# Patient Record
Sex: Female | Born: 1965 | Race: White | Hispanic: No | Smoking: Former smoker
Health system: Southern US, Community
[De-identification: ages and names within clinical notes are randomized; demographics above are authoritative.]

## PROBLEM LIST (undated history)

## (undated) DIAGNOSIS — R569 Unspecified convulsions: Secondary | ICD-10-CM

## (undated) DIAGNOSIS — J45909 Unspecified asthma, uncomplicated: Secondary | ICD-10-CM

## (undated) DIAGNOSIS — R112 Nausea with vomiting, unspecified: Secondary | ICD-10-CM

## (undated) DIAGNOSIS — T4145XA Adverse effect of unspecified anesthetic, initial encounter: Secondary | ICD-10-CM

## (undated) DIAGNOSIS — K648 Other hemorrhoids: Secondary | ICD-10-CM

## (undated) DIAGNOSIS — T8859XA Other complications of anesthesia, initial encounter: Secondary | ICD-10-CM

## (undated) DIAGNOSIS — IMO0001 Reserved for inherently not codable concepts without codable children: Secondary | ICD-10-CM

## (undated) DIAGNOSIS — F419 Anxiety disorder, unspecified: Secondary | ICD-10-CM

## (undated) DIAGNOSIS — F32A Depression, unspecified: Secondary | ICD-10-CM

## (undated) DIAGNOSIS — J42 Unspecified chronic bronchitis: Secondary | ICD-10-CM

## (undated) DIAGNOSIS — Z9889 Other specified postprocedural states: Secondary | ICD-10-CM

## (undated) DIAGNOSIS — K219 Gastro-esophageal reflux disease without esophagitis: Secondary | ICD-10-CM

## (undated) DIAGNOSIS — G43909 Migraine, unspecified, not intractable, without status migrainosus: Secondary | ICD-10-CM

## (undated) DIAGNOSIS — F329 Major depressive disorder, single episode, unspecified: Secondary | ICD-10-CM

## (undated) DIAGNOSIS — I8390 Asymptomatic varicose veins of unspecified lower extremity: Secondary | ICD-10-CM

## (undated) HISTORY — DX: Other hemorrhoids: K64.8

## (undated) HISTORY — DX: Unspecified asthma, uncomplicated: J45.909

## (undated) HISTORY — DX: Gastro-esophageal reflux disease without esophagitis: K21.9

## (undated) HISTORY — DX: Reserved for inherently not codable concepts without codable children: IMO0001

## (undated) HISTORY — DX: Migraine, unspecified, not intractable, without status migrainosus: G43.909

---

## 1996-07-31 HISTORY — PX: TYMPANOSTOMY TUBE PLACEMENT: SHX32

## 1996-07-31 HISTORY — PX: OTHER SURGICAL HISTORY: SHX169

## 1996-07-31 HISTORY — PX: HEMANGIOMA EXCISION: SHX1734

## 1998-03-17 ENCOUNTER — Ambulatory Visit (HOSPITAL_COMMUNITY): Admission: RE | Admit: 1998-03-17 | Discharge: 1998-03-17 | Payer: Self-pay | Admitting: Obstetrics and Gynecology

## 1998-07-05 ENCOUNTER — Encounter: Payer: Self-pay | Admitting: Orthopedic Surgery

## 1998-07-05 ENCOUNTER — Ambulatory Visit (HOSPITAL_COMMUNITY): Admission: RE | Admit: 1998-07-05 | Discharge: 1998-07-05 | Payer: Self-pay | Admitting: Orthopedic Surgery

## 1998-08-04 ENCOUNTER — Encounter: Payer: Self-pay | Admitting: Vascular Surgery

## 1998-08-04 ENCOUNTER — Ambulatory Visit (HOSPITAL_COMMUNITY): Admission: RE | Admit: 1998-08-04 | Discharge: 1998-08-04 | Payer: Self-pay | Admitting: Vascular Surgery

## 1998-08-18 ENCOUNTER — Ambulatory Visit (HOSPITAL_BASED_OUTPATIENT_CLINIC_OR_DEPARTMENT_OTHER): Admission: RE | Admit: 1998-08-18 | Discharge: 1998-08-18 | Payer: Self-pay | Admitting: Orthopedic Surgery

## 1998-09-09 ENCOUNTER — Encounter: Admission: RE | Admit: 1998-09-09 | Discharge: 1998-12-08 | Payer: Self-pay | Admitting: Orthopedic Surgery

## 1999-04-26 ENCOUNTER — Encounter: Payer: Self-pay | Admitting: Obstetrics and Gynecology

## 1999-04-26 ENCOUNTER — Ambulatory Visit (HOSPITAL_COMMUNITY): Admission: RE | Admit: 1999-04-26 | Discharge: 1999-04-26 | Payer: Self-pay | Admitting: Obstetrics and Gynecology

## 1999-05-08 ENCOUNTER — Inpatient Hospital Stay (HOSPITAL_COMMUNITY): Admission: AD | Admit: 1999-05-08 | Discharge: 1999-05-08 | Payer: Self-pay | Admitting: Obstetrics and Gynecology

## 1999-05-23 ENCOUNTER — Ambulatory Visit (HOSPITAL_COMMUNITY): Admission: RE | Admit: 1999-05-23 | Discharge: 1999-05-23 | Payer: Self-pay | Admitting: Obstetrics and Gynecology

## 1999-05-23 ENCOUNTER — Encounter: Payer: Self-pay | Admitting: Obstetrics and Gynecology

## 1999-06-29 ENCOUNTER — Inpatient Hospital Stay (HOSPITAL_COMMUNITY): Admission: AD | Admit: 1999-06-29 | Discharge: 1999-06-29 | Payer: Self-pay | Admitting: Obstetrics and Gynecology

## 1999-07-27 ENCOUNTER — Inpatient Hospital Stay (HOSPITAL_COMMUNITY): Admission: AD | Admit: 1999-07-27 | Discharge: 1999-07-27 | Payer: Self-pay | Admitting: Obstetrics and Gynecology

## 1999-08-04 ENCOUNTER — Inpatient Hospital Stay (HOSPITAL_COMMUNITY): Admission: AD | Admit: 1999-08-04 | Discharge: 1999-08-04 | Payer: Self-pay | Admitting: Obstetrics and Gynecology

## 1999-08-05 ENCOUNTER — Inpatient Hospital Stay (HOSPITAL_COMMUNITY): Admission: AD | Admit: 1999-08-05 | Discharge: 1999-08-05 | Payer: Self-pay | Admitting: Obstetrics and Gynecology

## 1999-09-14 ENCOUNTER — Inpatient Hospital Stay (HOSPITAL_COMMUNITY): Admission: AD | Admit: 1999-09-14 | Discharge: 1999-09-14 | Payer: Self-pay | Admitting: Obstetrics and Gynecology

## 1999-09-18 ENCOUNTER — Inpatient Hospital Stay (HOSPITAL_COMMUNITY): Admission: AD | Admit: 1999-09-18 | Discharge: 1999-09-18 | Payer: Self-pay | Admitting: Obstetrics and Gynecology

## 1999-09-23 ENCOUNTER — Inpatient Hospital Stay (HOSPITAL_COMMUNITY): Admission: AD | Admit: 1999-09-23 | Discharge: 1999-09-23 | Payer: Self-pay | Admitting: Obstetrics and Gynecology

## 1999-09-26 ENCOUNTER — Inpatient Hospital Stay (HOSPITAL_COMMUNITY): Admission: AD | Admit: 1999-09-26 | Discharge: 1999-09-26 | Payer: Self-pay | Admitting: Obstetrics and Gynecology

## 1999-09-29 ENCOUNTER — Inpatient Hospital Stay (HOSPITAL_COMMUNITY): Admission: AD | Admit: 1999-09-29 | Discharge: 1999-09-29 | Payer: Self-pay | Admitting: Obstetrics and Gynecology

## 1999-09-30 ENCOUNTER — Inpatient Hospital Stay (HOSPITAL_COMMUNITY): Admission: AD | Admit: 1999-09-30 | Discharge: 1999-09-30 | Payer: Self-pay | Admitting: Obstetrics and Gynecology

## 1999-10-02 ENCOUNTER — Inpatient Hospital Stay (HOSPITAL_COMMUNITY): Admission: AD | Admit: 1999-10-02 | Discharge: 1999-10-02 | Payer: Self-pay | Admitting: Obstetrics and Gynecology

## 1999-10-04 ENCOUNTER — Inpatient Hospital Stay (HOSPITAL_COMMUNITY): Admission: AD | Admit: 1999-10-04 | Discharge: 1999-10-04 | Payer: Self-pay | Admitting: Obstetrics and Gynecology

## 1999-10-10 ENCOUNTER — Inpatient Hospital Stay (HOSPITAL_COMMUNITY): Admission: AD | Admit: 1999-10-10 | Discharge: 1999-10-12 | Payer: Self-pay | Admitting: Obstetrics and Gynecology

## 1999-10-15 ENCOUNTER — Encounter: Admission: RE | Admit: 1999-10-15 | Discharge: 2000-01-13 | Payer: Self-pay | Admitting: Obstetrics and Gynecology

## 1999-11-21 ENCOUNTER — Other Ambulatory Visit: Admission: RE | Admit: 1999-11-21 | Discharge: 1999-11-21 | Payer: Self-pay | Admitting: Obstetrics and Gynecology

## 2000-07-19 ENCOUNTER — Encounter: Admission: RE | Admit: 2000-07-19 | Discharge: 2000-07-19 | Payer: Self-pay | Admitting: Internal Medicine

## 2000-07-19 ENCOUNTER — Encounter: Payer: Self-pay | Admitting: Internal Medicine

## 2001-02-19 ENCOUNTER — Other Ambulatory Visit: Admission: RE | Admit: 2001-02-19 | Discharge: 2001-02-19 | Payer: Self-pay | Admitting: Obstetrics and Gynecology

## 2001-04-25 ENCOUNTER — Encounter: Payer: Self-pay | Admitting: Obstetrics and Gynecology

## 2001-04-25 ENCOUNTER — Ambulatory Visit (HOSPITAL_COMMUNITY): Admission: RE | Admit: 2001-04-25 | Discharge: 2001-04-25 | Payer: Self-pay | Admitting: Obstetrics and Gynecology

## 2001-07-16 ENCOUNTER — Observation Stay (HOSPITAL_COMMUNITY): Admission: AD | Admit: 2001-07-16 | Discharge: 2001-07-17 | Payer: Self-pay | Admitting: Obstetrics and Gynecology

## 2001-08-24 ENCOUNTER — Inpatient Hospital Stay (HOSPITAL_COMMUNITY): Admission: AD | Admit: 2001-08-24 | Discharge: 2001-08-24 | Payer: Self-pay | Admitting: Obstetrics and Gynecology

## 2001-09-01 ENCOUNTER — Inpatient Hospital Stay (HOSPITAL_COMMUNITY): Admission: AD | Admit: 2001-09-01 | Discharge: 2001-09-01 | Payer: Self-pay | Admitting: Obstetrics and Gynecology

## 2001-09-20 ENCOUNTER — Inpatient Hospital Stay (HOSPITAL_COMMUNITY): Admission: AD | Admit: 2001-09-20 | Discharge: 2001-09-22 | Payer: Self-pay | Admitting: Obstetrics and Gynecology

## 2002-11-26 ENCOUNTER — Emergency Department (HOSPITAL_COMMUNITY): Admission: EM | Admit: 2002-11-26 | Discharge: 2002-11-27 | Payer: Self-pay | Admitting: Emergency Medicine

## 2003-02-18 ENCOUNTER — Encounter: Payer: Self-pay | Admitting: Obstetrics and Gynecology

## 2003-02-18 ENCOUNTER — Ambulatory Visit (HOSPITAL_COMMUNITY): Admission: RE | Admit: 2003-02-18 | Discharge: 2003-02-18 | Payer: Self-pay | Admitting: Obstetrics and Gynecology

## 2003-03-18 ENCOUNTER — Encounter: Payer: Self-pay | Admitting: Obstetrics and Gynecology

## 2003-03-18 ENCOUNTER — Ambulatory Visit (HOSPITAL_COMMUNITY): Admission: RE | Admit: 2003-03-18 | Discharge: 2003-03-18 | Payer: Self-pay | Admitting: Obstetrics and Gynecology

## 2003-06-11 ENCOUNTER — Inpatient Hospital Stay (HOSPITAL_COMMUNITY): Admission: AD | Admit: 2003-06-11 | Discharge: 2003-06-11 | Payer: Self-pay | Admitting: Obstetrics and Gynecology

## 2003-07-09 ENCOUNTER — Inpatient Hospital Stay (HOSPITAL_COMMUNITY): Admission: AD | Admit: 2003-07-09 | Discharge: 2003-07-09 | Payer: Self-pay | Admitting: Obstetrics and Gynecology

## 2003-07-16 ENCOUNTER — Inpatient Hospital Stay (HOSPITAL_COMMUNITY): Admission: AD | Admit: 2003-07-16 | Discharge: 2003-07-18 | Payer: Self-pay | Admitting: Obstetrics and Gynecology

## 2004-06-13 ENCOUNTER — Other Ambulatory Visit: Admission: RE | Admit: 2004-06-13 | Discharge: 2004-06-13 | Payer: Self-pay | Admitting: Obstetrics and Gynecology

## 2004-07-31 HISTORY — PX: NASAL SEPTUM SURGERY: SHX37

## 2005-01-12 ENCOUNTER — Inpatient Hospital Stay (HOSPITAL_COMMUNITY): Admission: AD | Admit: 2005-01-12 | Discharge: 2005-01-12 | Payer: Self-pay | Admitting: Obstetrics and Gynecology

## 2005-04-02 ENCOUNTER — Inpatient Hospital Stay (HOSPITAL_COMMUNITY): Admission: AD | Admit: 2005-04-02 | Discharge: 2005-04-02 | Payer: Self-pay | Admitting: Obstetrics and Gynecology

## 2005-04-04 ENCOUNTER — Inpatient Hospital Stay (HOSPITAL_COMMUNITY): Admission: AD | Admit: 2005-04-04 | Discharge: 2005-04-06 | Payer: Self-pay | Admitting: Obstetrics and Gynecology

## 2005-05-16 ENCOUNTER — Other Ambulatory Visit: Admission: RE | Admit: 2005-05-16 | Discharge: 2005-05-16 | Payer: Self-pay | Admitting: Obstetrics and Gynecology

## 2005-07-17 ENCOUNTER — Ambulatory Visit (HOSPITAL_COMMUNITY): Admission: RE | Admit: 2005-07-17 | Discharge: 2005-07-17 | Payer: Self-pay | Admitting: Obstetrics and Gynecology

## 2005-12-31 ENCOUNTER — Emergency Department (HOSPITAL_COMMUNITY): Admission: EM | Admit: 2005-12-31 | Discharge: 2005-12-31 | Payer: Self-pay | Admitting: Emergency Medicine

## 2006-07-31 HISTORY — PX: KNEE SURGERY: SHX244

## 2007-08-01 HISTORY — PX: SHOULDER SURGERY: SHX246

## 2007-08-01 HISTORY — PX: CLOSED MANIPULATION SHOULDER: SUR205

## 2007-12-30 ENCOUNTER — Encounter: Admission: RE | Admit: 2007-12-30 | Discharge: 2008-03-29 | Payer: Self-pay | Admitting: Orthopedic Surgery

## 2008-04-02 ENCOUNTER — Encounter: Admission: RE | Admit: 2008-04-02 | Discharge: 2008-05-21 | Payer: Self-pay | Admitting: Orthopedic Surgery

## 2008-11-27 ENCOUNTER — Ambulatory Visit: Payer: Self-pay | Admitting: Diagnostic Radiology

## 2008-11-27 ENCOUNTER — Emergency Department (HOSPITAL_BASED_OUTPATIENT_CLINIC_OR_DEPARTMENT_OTHER): Admission: EM | Admit: 2008-11-27 | Discharge: 2008-11-28 | Payer: Self-pay | Admitting: Emergency Medicine

## 2008-12-31 ENCOUNTER — Encounter: Admission: RE | Admit: 2008-12-31 | Discharge: 2009-03-31 | Payer: Self-pay | Admitting: Orthopedic Surgery

## 2010-02-01 ENCOUNTER — Ambulatory Visit
Admission: RE | Admit: 2010-02-01 | Discharge: 2010-02-01 | Payer: Self-pay | Source: Home / Self Care | Admitting: Obstetrics and Gynecology

## 2010-02-01 ENCOUNTER — Encounter (INDEPENDENT_AMBULATORY_CARE_PROVIDER_SITE_OTHER): Payer: Self-pay | Admitting: Obstetrics and Gynecology

## 2010-02-01 ENCOUNTER — Ambulatory Visit: Payer: Self-pay | Admitting: Surgery

## 2010-03-18 ENCOUNTER — Ambulatory Visit: Payer: Self-pay | Admitting: Obstetrics and Gynecology

## 2010-03-18 ENCOUNTER — Inpatient Hospital Stay (HOSPITAL_COMMUNITY)
Admission: AD | Admit: 2010-03-18 | Discharge: 2010-03-18 | Payer: Self-pay | Source: Home / Self Care | Admitting: Obstetrics and Gynecology

## 2010-03-25 ENCOUNTER — Inpatient Hospital Stay (HOSPITAL_COMMUNITY)
Admission: AD | Admit: 2010-03-25 | Discharge: 2010-03-27 | Payer: Self-pay | Source: Home / Self Care | Admitting: Obstetrics and Gynecology

## 2010-07-31 HISTORY — PX: FOOT SURGERY: SHX648

## 2010-10-14 LAB — COMPREHENSIVE METABOLIC PANEL
AST: 19 U/L (ref 0–37)
Albumin: 3 g/dL — ABNORMAL LOW (ref 3.5–5.2)
Calcium: 9.1 mg/dL (ref 8.4–10.5)
Creatinine, Ser: 0.5 mg/dL (ref 0.4–1.2)
GFR calc Af Amer: >60 mL/min (ref >60)

## 2010-10-14 LAB — CBC
HCT: 36.8 % (ref 36.0–46.0)
MCH: 33.3 pg (ref 26.0–34.0)
MCH: 33.6 pg (ref 26.0–34.0)
MCHC: 34.7 g/dL (ref 30.0–36.0)
MCHC: 34.7 g/dL (ref 30.0–36.0)
MCV: 96.9 fL (ref 78.0–100.0)
Platelets: 146 10*3/uL — ABNORMAL LOW (ref 150–400)
Platelets: 171 10*3/uL (ref 150–400)
Platelets: 179 10*3/uL (ref 150–400)
RDW: 13.8 % (ref 11.5–15.5)
RDW: 13.8 % (ref 11.5–15.5)
RDW: 13.9 % (ref 11.5–15.5)
WBC: 8.9 10*3/uL (ref 4.0–10.5)

## 2010-10-14 LAB — URINALYSIS, ROUTINE W REFLEX MICROSCOPIC
Bilirubin Urine: NEGATIVE
Hgb urine dipstick: NEGATIVE
Protein, ur: NEGATIVE mg/dL
Specific Gravity, Urine: 1.015 (ref 1.005–1.030)
Urobilinogen, UA: 0.2 mg/dL (ref 0.0–1.0)
pH: 6.5 (ref 5.0–8.0)

## 2010-10-14 LAB — CCBB MATERNAL DONOR DRAW

## 2010-12-16 NOTE — Consult Note (Signed)
Jamie Sweeney, Jamie Sweeney              ACCOUNT NO.:  [id number]   MEDICAL RECORD NO.:  [id number]            PATIENT TYPE:   LOCATION:                                 FACILITY:   PHYSICIAN:  Karol T. Lazarus Salines, M.D.      DATE OF BIRTH:   DATE OF CONSULTATION:  12/31/2005  DATE OF DISCHARGE:                                   CONSULTATION   EMERGENCY ROOM CONSULTATION   CHIEF COMPLAINT:  Nasal pain.   HISTORY:  A 45 year old white female underwent nasal septoplasty with Dr.  Jearld Fenton 2 days ago.  She is having relatively intense pain with the nasal  packing, and a sense of claustrophobia/air hunger.  No active bleeding.  No  fever.  She had 2 days of Lortab which she has now exhausted.   PHYSICAL EXAMINATION:  This is a trim healthy-appearing adult white female.  She has an nasal drip pad in place and packs in place as well.  Pharynx is  clear.   IMPRESSION:  Satisfactory postop check.   PLAN:  Packs removed each side x1.  She tolerated this nicely.  I have her  an additional prescription for Lortab 7.5/500 one p.o. q.4-6 h., #20, one  refill.  She will practice nasal hygiene measures now, including liberal  nasal saline spray.  Recheck Dr. Jearld Fenton in 2 weeks.      Gloris Manchester. Lazarus Salines, M.D.  Electronically Signed     KTW/MEDQ  D:  12/31/2005  T:  01/01/2006  Job:  [id number]

## 2010-12-16 NOTE — Discharge Summary (Signed)
Jamie Sweeney, Jamie Sweeney              ACCOUNT NO.:  [id number]   MEDICAL RECORD NO.:  [id number]          PATIENT TYPE:  INP   LOCATION:  9113                          FACILITY:  [facility]   PHYSICIAN:  Zenaida Niece, M.D.DATE OF BIRTH:  1965/10/01   DATE OF ADMISSION:  04/04/2005  DATE OF DISCHARGE:  04/06/2005                                 DISCHARGE SUMMARY   ADMISSION DIAGNOSES:  1.  Intrauterine pregnancy at 39 weeks, grand multiparous patient.  2.  Advanced maternal age.   DISCHARGE DIAGNOSES:  1.  Intrauterine pregnancy at 39 weeks, grand multiparous patient.  2.  Advanced maternal age.   PROCEDURE:  On September 5, she had a spontaneous vaginal delivery.   HISTORY AND PHYSICAL:  This is a 45 year old, white female, G6, P5-0-0-5  with an EGA of [redacted] weeks who presents for elective induction.  Prenatal care  complicated by advanced maternal age with normal first trimester screening  and normal ultrasound and she also had bleeding hemorrhoids.   PAST OBSTETRICAL HISTORY:  Vaginal delivery at term x5 with weights ranging  from 7 pounds 8 ounces to 8 pounds 7 ounces.   PAST MEDICAL HISTORY:  1.  Mild asthma.  2.  Migraines.  3.  Back problems.   PAST SURGICAL HISTORY:  Vulvoplasty.   ALLERGIES:  SULFA.   CURRENT MEDICATIONS:  Analpram.   PRENATAL LABORATORY DATA:  Blood type is A+ with a negative antibody screen,  RPR nonreactive, rubella immune, hepatitis B surface antigen negative.  Gonorrhea and chlamydia negative.  One-hour Glucola is 85 and Group B  Streptococcus is negative.   PHYSICAL EXAMINATION:  VITAL SIGNS:  She is afebrile with stable vital  signs.  Fetal heart tracing is reactive with occasional contractions.  ABDOMEN:  Gravid, nontender with estimated fetal weight of 8 pounds.  PELVIC:  Cervix was 3-4, 50, -2, vertex presentation and amniotomy revealed  clear fluid.   HOSPITAL COURSE:  The patient was admitted and had amniotomy performed for  induction.   She then required Pitocin to enter active labor.  She entered  labor, received an epidural and progressed to complete and pushed well.  On  the afternoon of September5, she had a vaginal delivery of a viable female  infant with Apgar's of 7 and 9 that weighed 8 pounds 2 ounces.  The baby  delivered through a shoulder cord and there was terminal meconium.  Placenta  delivered spontaneous and was intact.  Perineum had a few small superficial  abrasions that were hemostatic and estimated blood loss was less than 500  cc.  Postpartum, she had no significant complications and on postpartum day  #2, is stable for discharge home.   DIET:  Regular diet.   ACTIVITY:  Pelvic rest.   FOLLOW UP:  Follow up in 6 weeks.   DISCHARGE MEDICATIONS:  1.  Over-the-counter ibuprofen as needed.  2.  Percocet #20 one to two p.o. q.4-6h. p.r.n. pain.   SPECIAL INSTRUCTIONS:  She is also given our discharge pamphlet.      Zenaida Niece, M.D.  Electronically Signed  TDM/MEDQ  D:  04/06/2005  T:  04/06/2005  Job:  [id number]

## 2011-03-29 ENCOUNTER — Other Ambulatory Visit: Payer: Self-pay | Admitting: Obstetrics and Gynecology

## 2011-03-29 DIAGNOSIS — N6310 Unspecified lump in the right breast, unspecified quadrant: Secondary | ICD-10-CM

## 2011-04-05 ENCOUNTER — Ambulatory Visit
Admission: RE | Admit: 2011-04-05 | Discharge: 2011-04-05 | Disposition: A | Discharge status: Home / Self Care | Payer: Commercial Indemnity | Source: Ambulatory Visit | Attending: Obstetrics and Gynecology | Admitting: Obstetrics and Gynecology

## 2011-04-05 DIAGNOSIS — N6310 Unspecified lump in the right breast, unspecified quadrant: Secondary | ICD-10-CM

## 2011-06-07 ENCOUNTER — Ambulatory Visit: Payer: Commercial Indemnity | Attending: Sports Medicine | Admitting: Physical Therapy

## 2011-06-07 DIAGNOSIS — M25673 Stiffness of unspecified ankle, not elsewhere classified: Secondary | ICD-10-CM | POA: Insufficient documentation

## 2011-06-07 DIAGNOSIS — R262 Difficulty in walking, not elsewhere classified: Secondary | ICD-10-CM | POA: Insufficient documentation

## 2011-06-07 DIAGNOSIS — M25579 Pain in unspecified ankle and joints of unspecified foot: Secondary | ICD-10-CM | POA: Insufficient documentation

## 2011-06-07 DIAGNOSIS — M25676 Stiffness of unspecified foot, not elsewhere classified: Secondary | ICD-10-CM | POA: Insufficient documentation

## 2011-06-07 DIAGNOSIS — IMO0001 Reserved for inherently not codable concepts without codable children: Secondary | ICD-10-CM | POA: Insufficient documentation

## 2011-06-09 ENCOUNTER — Ambulatory Visit: Payer: Commercial Indemnity | Admitting: Physical Therapy

## 2011-06-12 ENCOUNTER — Ambulatory Visit: Payer: Commercial Indemnity | Admitting: Physical Therapy

## 2011-06-15 ENCOUNTER — Ambulatory Visit: Payer: Commercial Indemnity | Admitting: Physical Therapy

## 2011-06-20 ENCOUNTER — Ambulatory Visit: Payer: Commercial Indemnity | Admitting: Physical Therapy

## 2011-06-29 ENCOUNTER — Ambulatory Visit: Payer: Commercial Indemnity | Admitting: Physical Therapy

## 2011-06-30 ENCOUNTER — Ambulatory Visit: Payer: Commercial Indemnity | Admitting: Physical Therapy

## 2011-07-11 ENCOUNTER — Ambulatory Visit: Payer: Commercial Indemnity | Attending: Sports Medicine | Admitting: Physical Therapy

## 2011-07-11 DIAGNOSIS — M25673 Stiffness of unspecified ankle, not elsewhere classified: Secondary | ICD-10-CM | POA: Insufficient documentation

## 2011-07-11 DIAGNOSIS — M25579 Pain in unspecified ankle and joints of unspecified foot: Secondary | ICD-10-CM | POA: Insufficient documentation

## 2011-07-11 DIAGNOSIS — IMO0001 Reserved for inherently not codable concepts without codable children: Secondary | ICD-10-CM | POA: Insufficient documentation

## 2011-07-11 DIAGNOSIS — R262 Difficulty in walking, not elsewhere classified: Secondary | ICD-10-CM | POA: Insufficient documentation

## 2011-07-11 DIAGNOSIS — M25676 Stiffness of unspecified foot, not elsewhere classified: Secondary | ICD-10-CM | POA: Insufficient documentation

## 2011-07-13 ENCOUNTER — Ambulatory Visit: Payer: Commercial Indemnity | Admitting: Physical Therapy

## 2012-03-12 ENCOUNTER — Encounter (INDEPENDENT_AMBULATORY_CARE_PROVIDER_SITE_OTHER): Payer: Self-pay | Admitting: General Surgery

## 2012-03-12 ENCOUNTER — Encounter (INDEPENDENT_AMBULATORY_CARE_PROVIDER_SITE_OTHER): Payer: Self-pay

## 2012-03-25 ENCOUNTER — Ambulatory Visit (INDEPENDENT_AMBULATORY_CARE_PROVIDER_SITE_OTHER): Payer: Commercial Indemnity | Admitting: Surgery

## 2012-03-31 HISTORY — PX: HEMORRHOID BANDING: SHX5850

## 2012-04-10 ENCOUNTER — Encounter (INDEPENDENT_AMBULATORY_CARE_PROVIDER_SITE_OTHER): Payer: Self-pay | Admitting: Surgery

## 2012-04-10 ENCOUNTER — Telehealth (INDEPENDENT_AMBULATORY_CARE_PROVIDER_SITE_OTHER): Payer: Self-pay | Admitting: General Surgery

## 2012-04-10 ENCOUNTER — Ambulatory Visit (INDEPENDENT_AMBULATORY_CARE_PROVIDER_SITE_OTHER): Payer: Commercial Indemnity | Admitting: Surgery

## 2012-04-10 VITALS — BP 116/70 | HR 69 | Ht 63.0 in | Wt 170.2 lb

## 2012-04-10 DIAGNOSIS — K219 Gastro-esophageal reflux disease without esophagitis: Secondary | ICD-10-CM | POA: Insufficient documentation

## 2012-04-10 DIAGNOSIS — K648 Other hemorrhoids: Secondary | ICD-10-CM

## 2012-04-10 HISTORY — DX: Other hemorrhoids: K64.8

## 2012-04-10 NOTE — Progress Notes (Signed)
Subjective:     Patient ID: Jamie Sweeney, female   DOB: April 19, 1966, 46 y.o.   MRN: [id number]  HPI  BRETTNEY FICKEN  1965-09-19 [id number]  Patient Care Team: Mardelle Matte as PCP - General Vertell Novak., MD as Consulting Physician (Gastroenterology)  This patient is a 46 y.o.female who presents today for surgical evaluation at the request of Dr. Randa Evens.   Reason for visit: Rectal bleeding.  Internal hemorrhoids.  Pleasant woman.  Noticed with her fourth pregnancy rectal bleeding.  Sigmoidoscopy done.  Found to have internal hemorrhoids.  Has had a few more children.  Mild intermittent rectal bleeding.  Had episode of chest pain and choking episode.  Cardiac workup negative.  EGD done.  Had colonoscopy.  No other abnormalities except for moderate internal hemorrhoids.  [facility]imeter for evaluation.  She used to struggle with intermittent constipation and diarrhea.  Takes a fiber supplement.  Now having one to two bowel today.  Still struggle with bleeding.  No definite diagnosis of IBS.  No personal nor family history of GI/colon cancer, inflammatory bowel disease, irritable bowel syndrome, allergy such as Celiac Sprue, dietary/dairy problems, colitis, ulcers nor gastritis.  No recent sick contacts/gastroenteritis.  No travel outside the country.  No changes in diet.    Patient Active Problem List  Diagnosis  . Internal hemorrhoids with bleeding  . GERD (gastroesophageal reflux disease)    Past Medical History  Diagnosis Date  . Asthma   . Hemorrhoids   . Migraine headache   . Reflux   . GERD (gastroesophageal reflux disease)     Past Surgical History  Procedure Date  . Vulvoplasty 1998  . Hemangioma excision 1998    right hand  . Shoulder surgery 2009    right  . Knee surgery 2009    right  . Foot surgery 2012    right plantar    History   Social History  . Marital Status: Married    Spouse Name: N/A    Number of Children: N/A  . Years of Education:  N/A   Occupational History  . Not on file.   Social History Main Topics  . Smoking status: Former Smoker    Quit date: 03/13/1995  . Smokeless tobacco: Not on file  . Alcohol Use: Yes     once every 3-4 months  . Drug Use: No  . Sexually Active: Not on file   Other Topics Concern  . Not on file   Social History Narrative  . No narrative on file    Family History  Problem Relation Age of Onset  . Prostate cancer Father   . Cancer Father     prostate/ skin  . Liver cancer Paternal Grandfather   . Cancer Paternal Grandfather     lung  . Cancer Paternal Aunt     breast  . Cancer Paternal Grandmother     breast  . Cancer Maternal Uncle     throat/skin    Current Outpatient Prescriptions  Medication Sig Dispense Refill  . albuterol (PROVENTIL) (2.5 MG/3ML) 0.083% nebulizer solution Take 2.5 mg by nebulization every 6 (six) hours as needed.      Marland Kitchen azelastine (ASTELIN) 137 MCG/SPRAY nasal spray Place 1 spray into the nose 2 (two) times daily. Use in each nostril as directed      . cetirizine (ZYRTEC) 10 MG tablet Take 10 mg by mouth daily.      . fexofenadine (ALLEGRA) 180 MG tablet Take 180  mg by mouth daily.      . fluticasone (VERAMYST) 27.5 MCG/SPRAY nasal spray Place 2 sprays into the nose daily.      Marland Kitchen ketotifen (ZADITOR) 0.025 % ophthalmic solution 1 drop 2 (two) times daily.      . montelukast (SINGULAIR) 10 MG tablet Take 10 mg by mouth at bedtime.      . Multiple Vitamin (MULTIVITAMIN) tablet Take 1 tablet by mouth daily.      . Olopatadine HCl (PATADAY) 0.2 % SOLN Apply to eye.      Marland Kitchen omeprazole (PRILOSEC) 40 MG capsule Take 40 mg by mouth daily.      . traMADol (ULTRAM) 50 MG tablet Take 50 mg by mouth every 6 (six) hours as needed.         Allergies  Allergen Reactions  . Diflucan (Fluconazole) Hives and Rash  . Sulfa Antibiotics Rash    BP 116/70  Pulse 69  Ht 5\' 3"  (1.6 m)  Wt 170 lb 3.2 oz (77.202 kg)  BMI 30.15 kg/m2  SpO2 98%  No results  found.   Review of Systems  Constitutional: Negative for fever, chills, diaphoresis, appetite change and fatigue.  HENT: Negative for ear pain, sore throat, trouble swallowing, neck pain and ear discharge.   Eyes: Negative for photophobia, discharge and visual disturbance.  Respiratory: Negative for cough, choking, chest tightness and shortness of breath.   Cardiovascular: Negative for chest pain and palpitations.  Gastrointestinal: Negative for nausea, vomiting, abdominal pain, diarrhea, constipation, anal bleeding and rectal pain.  Genitourinary: Negative for dysuria, frequency and difficulty urinating.  Musculoskeletal: Negative for myalgias and gait problem.  Skin: Negative for color change, pallor and rash.  Neurological: Negative for dizziness, speech difficulty, weakness and numbness.  Hematological: Negative for adenopathy.  Psychiatric/Behavioral: Negative for confusion and agitation. The patient is not nervous/anxious.        Objective:   Physical Exam  Constitutional: She is oriented to person, place, and time. She appears well-developed and well-nourished. No distress.  HENT:  Head: Normocephalic.  Mouth/Throat: Oropharynx is clear and moist. No oropharyngeal exudate.  Eyes: Conjunctivae normal and EOM are normal. Pupils are equal, round, and reactive to light. No scleral icterus.  Neck: Normal range of motion. Neck supple. No tracheal deviation present.  Cardiovascular: Normal rate, regular rhythm and intact distal pulses.   Pulmonary/Chest: Effort normal and breath sounds normal. No respiratory distress. She exhibits no tenderness.  Abdominal: Soft. She exhibits no distension and no mass. There is no tenderness. Hernia confirmed negative in the right inguinal area and confirmed negative in the left inguinal area.  Genitourinary: No vaginal discharge found.       Exam done with assistance of female Medical Assistant in the room.  Perianal skin clean with good hygiene.   No pruritis.  No external skin tags / hemorrhoids of significance.  No pilonidal disease.  No fissure.  No abscess/fistula.   Tolerates digital and anoscopic rectal exam.  Normal sphincter tone.  No rectal masses.  Hemorrhoidal piles moderately enlarged with mild R ant hem prolapse  Musculoskeletal: Normal range of motion. She exhibits no tenderness.  Lymphadenopathy:    She has no cervical adenopathy.       Right: No inguinal adenopathy present.       Left: No inguinal adenopathy present.  Neurological: She is alert and oriented to person, place, and time. No cranial nerve deficit. She exhibits normal muscle tone. Coordination normal.  Skin: Skin is warm and dry.  No rash noted. She is not diaphoretic. No erythema.  Psychiatric: She has a normal mood and affect. Her behavior is normal. Judgment and thought content normal.       Assessment:     Int hemorrhoids with bleeding despite better BMs on fiber bowel regimen    Plan:     I banded them:  The anatomy & physiology of the anorectal region was discussed.  The pathophysiology of hemorrhoids and differential diagnosis was discussed.  Natural history progression  was discussed.   I stressed the importance of a bowel regimen to have daily soft bowel movements to minimize progression of disease.     The patient's symptoms are not adequately controlled.  Therefore, I recommended banding to treat the hemorrhoids.  I went over the technique, risks, benefits, and alternatives.   Goals of post-operative recovery were discussed as well.  Questions were answered.  The patient expressed understanding & wished to proceed.  The patient was positioned in the lateral decubitus position.  Perianal & rectal examination was done.  Using anoscopy, I ligated the hemorrhoids above the dentate line with banding x 3 piles.  The patient tolerated the procedure well.  Educational handouts further explaining the pathology, treatment options, and bowel regimen were  given as well.    Hopefully, with her better bowel regimen she will avoid any further issues.  May benefit from re\re banding.  If having persistent bleeding, may need the [facility] hemorrhoidal ligation and pexy.  I'm hopeful will not come to that.  She will call if further issues arise.

## 2012-04-10 NOTE — Telephone Encounter (Signed)
Patient called to report passing one of her bands, some tissue and some blood with her stool this evening.  She denies profuse bleeding, inability to urinate, passing clots or fevers.  She does have some pelvic pressure.  I told her that as long as she doesn't develop the symptoms above, her pressure and bleeding should resolve.  She will call me back if her symptoms worsen.

## 2012-04-10 NOTE — Patient Instructions (Addendum)

## 2012-09-28 HISTORY — PX: HEMORRHOID BANDING: SHX5850

## 2012-10-08 ENCOUNTER — Ambulatory Visit (INDEPENDENT_AMBULATORY_CARE_PROVIDER_SITE_OTHER): Payer: Commercial Indemnity | Admitting: Surgery

## 2012-10-08 ENCOUNTER — Encounter (INDEPENDENT_AMBULATORY_CARE_PROVIDER_SITE_OTHER): Payer: Self-pay | Admitting: Surgery

## 2012-10-08 VITALS — BP 116/78 | HR 80 | Temp 98.4°F | Resp 16 | Ht 63.0 in | Wt 136.8 lb

## 2012-10-08 DIAGNOSIS — K648 Other hemorrhoids: Secondary | ICD-10-CM

## 2012-10-08 NOTE — Patient Instructions (Addendum)
ANORECTAL SURGERY: POST OP INSTRUCTIONS  1. Take your usually prescribed home medications unless otherwise directed. 2. DIET: Follow a light bland diet the first 24 hours after arrival home, such as soup, liquids, crackers, etc.  Be sure to include lots of fluids daily.  Avoid fast food or heavy meals as your are more likely to get nauseated.  Eat a low fat the next few days after surgery.   3. PAIN CONTROL: a. Pain is best controlled by a usual combination of three different methods TOGETHER: i. Ice/Heat ii. Over the counter pain medication iii. Prescription pain medication b. Most patients will experience some swelling and discomfort in the anus/rectal area. and incisions.  Ice packs or heat (30-60 minutes up to 6 times a day) will help. Use ice for the first few days to help decrease swelling and bruising, then switch to heat such as warm towels, sitz baths, warm baths, etc to help relax tight/sore spots and speed recovery.  Some people prefer to use ice alone, heat alone, alternating between ice & heat.  Experiment to what works for you.  Swelling and bruising can take several weeks to resolve.   c. It is helpful to take an over-the-counter pain medication regularly for the first few weeks.  Choose one of the following that works best for you: i. Naproxen (Aleve, etc)  Two 220mg tabs twice a day ii. Ibuprofen (Advil, etc) Three 200mg tabs four times a day (every meal & bedtime) iii. Acetaminophen (Tylenol, etc) 500-650mg four times a day (every meal & bedtime) d. A  prescription for pain medication (such as oxycodone, hydrocodone, etc) should be given to you upon discharge.  Take your pain medication as prescribed.  i. If you are having problems/concerns with the prescription medicine (does not control pain, nausea, vomiting, rash, itching, etc), please call us [phone] to see if we need to switch you to a different pain medicine that will work better for you and/or control your side effect  better. ii. If you need a refill on your pain medication, please contact your pharmacy.  They will contact our office to request authorization. Prescriptions will not be filled after 5 pm or on week-ends. 4. KEEP YOUR BOWELS REGULAR a. The goal is one bowel movement a day b. Avoid getting constipated.  Between the surgery and the pain medications, it is common to experience some constipation.  Increasing fluid intake and taking a fiber supplement (such as Metamucil, Citrucel, FiberCon, MiraLax, etc) 1-2 times a day regularly will usually help prevent this problem from occurring.  A mild laxative (prune juice, Milk of Magnesia, MiraLax, etc) should be taken according to package directions if there are no bowel movements after 48 hours. c. Watch out for diarrhea.  If you have many loose bowel movements, simplify your diet to bland foods & liquids for a few days.  Stop any stool softeners and decrease your fiber supplement.  Switching to mild anti-diarrheal medications (Kayopectate, Pepto Bismol) can help.  If this worsens or does not improve, please call us.  5. Wound Care a. Remove your bandages the day after surgery.  Unless discharge instructions indicate otherwise, leave your bandage dry and in place overnight.  Remove the bandage during your first bowel movement.   b. Allow the wound packing to fall out over the next few days.  You can trim exposed gauze / ribbon as it falls out.  You do not need to repack the wound unless instructed otherwise.  Wear an   absorbent pad or soft cotton gauze in your underwear as needed to catch any drainage and help keep the area  c. Keep the area clean and dry.  Bathe / shower every day.  Keep the area clean by showering / bathing over the incision / wound.   It is okay to soak an open wound to help wash it.  Wet wipes or showers / gentle washing after bowel movements is often less traumatic than regular toilet paper. d. You may have some styrofoam-like soft packing in  the rectum which will come out with the first bowel movement.  e. You will often notice bleeding with bowel movements.  This should slow down by the end of the first week of surgery f. Expect some drainage.  This should slow down, too, by the end of the first week of surgery.  Wear an absorbent pad or soft cotton gauze in your underwear until the drainage stops. 6. ACTIVITIES as tolerated:   a. You may resume regular (light) daily activities beginning the next day-such as daily self-care, walking, climbing stairs-gradually increasing activities as tolerated.  If you can walk 30 minutes without difficulty, it is safe to try more intense activity such as jogging, treadmill, bicycling, low-impact aerobics, swimming, etc. b. Save the most intensive and strenuous activity for last such as sit-ups, heavy lifting, contact sports, etc  Refrain from any heavy lifting or straining until you are off narcotics for pain control.   c. DO NOT PUSH THROUGH PAIN.  Let pain be your guide: If it hurts to do something, don't do it.  Pain is your body warning you to avoid that activity for another week until the pain goes down. d. You may drive when you are no longer taking prescription pain medication, you can comfortably sit for long periods of time, and you can safely maneuver your car and apply brakes. e. You may have sexual intercourse when it is comfortable.  7. FOLLOW UP in our office a. Please call [facility] at [phone] to set up an appointment to see your surgeon in the office for a follow-up appointment approximately 2 weeks after your surgery. b. Make sure that you call for this appointment the day you arrive home to insure a convenient appointment time. 10. IF YOU HAVE DISABILITY OR FAMILY LEAVE FORMS, BRING THEM TO THE OFFICE FOR PROCESSING.  DO NOT GIVE THEM TO YOUR DOCTOR.        WHEN TO CALL US [phone]: 1. Poor pain control 2. Reactions / problems with new medications (rash/itching, nausea,  etc)  3. Fever over 101.5 F (38.5 C) 4. Inability to urinate 5. Nausea and/or vomiting 6. Worsening swelling or bruising 7. Continued bleeding from incision. 8. Increased pain, redness, or drainage from the incision  The clinic staff is available to answer your questions during regular business hours (8:30am-5pm).  Please don't hesitate to call and ask to speak to one of our nurses for clinical concerns.   A surgeon from [facility] is always on call at the hospitals   If you have a medical emergency, go to the nearest emergency room or call 911.    [facility] [address], [address], [place], [place]  [address] ? MAIN: [phone] ? TOLL FREE: [phone] ? FAX [phone] [email or web address]   HEMORRHOIDS  The rectum is the last foot of your colon, and it naturally stretches to hold stool.  Hemorrhoidal piles are natural clusters of blood vessels   that help the rectum and anal canal stretch to hold stool and allow bowel movements to eliminate feces.   Hemorrhoids are abnormally swollen blood vessels in the rectum.  Too much pressure in the rectum causes hemorrhoids by forcing blood to stretch and bulge the walls of the veins, sometimes even rupturing them.  Hemorrhoids can become like varicose veins you might see on a person's legs.  Most people will develop a flare of hemorrhoids in their lifetime.  When bulging hemorrhoidal veins are irritated, they can swell, burn, itch, cause pain, and bleed.  Most flares will calm down gradually own within a few weeks.  However, once hemorrhoids are created, they are difficult to get rid of completely and tend to flare more easily than the first flare.   Fortunately, good habits and simple medical treatment usually control hemorrhoids well, and surgery is needed only in severe cases. Types of Hemorrhoids:  Internal hemorrhoids usually don't initially hurt or itch; they are deep inside the rectum  and usually have no sensation. If they begin to push out (prolapse), pain and burning can occur.  However, internal hemorrhoids can bleed.  Anal bleeding should not be ignored since bleeding could come from a dangerous source like colorectal cancer, so persistent rectal bleeding should be investigated by a doctor, sometimes with a colonoscopy.  External hemorrhoids cause most of the symptoms - pain, burning, and itching. Nonirritated hemorrhoids can look like small skin tags coming out of the anus.   Thrombosed hemorrhoids can form when a hemorrhoid blood vessel bursts and causes the hemorrhoid to suddenly swell.  A purple blood clot can form in it and become an excruciatingly painful lump at the anus. Because of these unpleasant symptoms, immediate incision and drainage by a surgeon at an office visit can provide much relief of the pain.    PREVENTION Avoiding the most frequent causes listed below will prevent most cases of hemorrhoids: Constipation Hard stools Diarrhea  Constant sitting  Straining with bowel movements Sitting on the toilet for a long time  Severe coughing  episodes Pregnancy / Childbirth  Heavy Lifting  Sometimes avoiding the above triggers is difficult:  How can you avoid sitting all day if you have a seated job? Also, we try to avoid coughing and diarrhea, but sometimes it's beyond your control.  Still, there are some practical hints to help: Keep the anal and genital area clean.  Moistened tissues such as flushable wet wipes are less irritating than toilet paper.  Using irrigating showers or bottle irrigation washing gently cleans this sensitive area.   Avoid dry toilet paper when cleaning after bowel movements.  . Keep the anal and genital area dry.  Lightly pat the rectal area dry.  Avoid rubbing.  Talcum or baby powders can help GET YOUR STOOLS SOFT.   This is the most important way to prevent irritated hemorrhoids.  Hard stools are like sandpaper to the anorectal canal and  will cause more problems.  The goal: ONE SOFT BOWEL MOVEMENT A DAY!  BMs from every other day to 3 times a day is a tolerable range Treat coughing, diarrhea and constipation early since irritated hemorrhoids may soon follow.  If your main job activity is seated, always stand or walk during your breaks. Make it a point to stand and walk at least 5 minutes every hour and try to shift frequently in your chair to avoid direct rectal pressure.  Always exhale as you strain or lift. Don't hold your breath.    Do not delay or try to prevent a bowel movement when the urge is present. Exercise regularly (walking or jogging 60 minutes a day) to stimulate the bowels to move. No reading or other activity while on the toilet. If bowel movements take longer than 5 minutes, you are too constipated. AVOID CONSTIPATION Drink plenty of liquids (1 1/2 to 2 quarts of water and other fluids a day unless fluid restricted for another medical condition). Liquids that contain caffeine (coffee a, tea, soft drinks) can be dehydrating and should be avoided until constipation is controlled. Consider minimizing milk, as dairy products may be constipating. Eat plenty of fiber (30g a day ideal, more if needed).  Fiber is the undigested part of plant food that passes into the colon, acting as "natures broom" to encourage bowel motility and movement.  Fiber can absorb and hold large amounts of water. This results in a larger, bulkier stool, which is soft and easier to pass.  Eating foods high in fiber - 12 servings - such as  Vegetables: Root (potatoes, carrots, turnips), Leafy green (lettuce, salad greens, celery, spinach), High residue (cabbage, broccoli, etc.) Fruit: Fresh, Dried (prunes, apricots, cherries), Stewed (applesauce)  Whole grain breads, pasta, whole wheat Bran cereals, muffins, etc. Consider adding supplemental bulking fiber which retains large volumes of water: Psyllium ground seeds --available as Metamucil, Konsyl,  Effersyllium, Per Diem Fiber, or the less expensive generic forms.  Citrucel  (methylcellulose wood fiber) . FiberCon (Polycarbophil) Polyethylene Glycol - and "artificial" fiber commonly called Miralax or Glycolax.  It is helpful for people with gassy or bloated feelings with regular fiber Flax Seed - a less gassy natural fiber  Laxatives can be useful for a short period if constipation is severe Osmotics (Milk of Magnesia, Fleets Phospho-Soda, Magnesium Citrate)  Stimulants (Senokot,   Castor Oil,  Dulcolax, Ex-Lax)    Laxatives are not a good long-term solution as it can stress the bowels and cause too much mineral loss and dehydration.   Avoid taking laxatives for more than 7 days in a row.  AVOID DIARRHEA Switch to liquids and simpler foods for a few days to avoid stressing your intestines further. Avoid dairy products (especially milk & ice cream) for a short time.  The intestines often can lose the ability to digest lactose when stressed. Avoid foods that cause gassiness or bloating.  Typical foods include beans and other legumes, cabbage, broccoli, and dairy foods.  Every person has some sensitivity to other foods, so listen to your body and avoid those foods that trigger problems for you. Adding fiber (Citrucel, Metamucil, FiberCon, Flax seed, Miralax) gradually can help thicken stools by absorbing excess fluid and retrain the intestines to act more normally.  Slowly increase the dose over a few weeks.  Too much fiber too soon can backfire and cause cramping & bloating. Probiotics (such as active yogurt, Align, etc) may help repopulate the intestines and colon with normal bacteria and calm down a sensitive digestive tract.  Most studies show it to be of mild help, though, and such products can be costly. Medicines: Bismuth subsalicylate (ex. Kayopectate, Pepto Bismol) every 30 minutes for up to 6 doses can help control diarrhea.  Avoid if pregnant. Loperamide (Immodium) can slow down  diarrhea.  Start with two tablets (4mg total) first and then try one tablet every 6 hours.  Avoid if you are having fevers or severe pain.  If you are not better or start feeling worse, stop all medicines and call your doctor   for advice Call your doctor if you are getting worse or not better.  Sometimes further testing (cultures, endoscopy, X-ray studies, bloodwork, etc) may be needed to help diagnose and treat the cause of the diarrhea. TREATMENT OF HEMORRHOID FLARE If these preventive measures fail, you must take action right away! Hemorrhoids are one condition that can be mild in the morning and become intolerable by nightfall. Most hemorrhoidal flares take several weeks to calm down.  These suggestions can help: Warm soaks.  This helps more than any topical medication.  Use up to 8 times a day.  Usually sitz baths or sitting in a warm bathtub helps.  Sitting on moist warm towels are helpful.  Switching to ice packs/cool compresses can be helpful Normalize your bowels.  Extremes of diarrhea or constipation will make hemorrhoids worse.  One soft bowel movement a day is the goal.  Fiber can help get your bowels regular Wet wipes instead of toilet paper Pain control with a NSAID such as ibuprofen (Advil) or naproxen (Aleve) or acetaminophen (Tylenol) around the clock.  Narcotics are constipating and should be minimized if possible Topical creams contain steroids (bydrocortisone) or local anesthetic (xylocaine) can help make pain and itching more tolerable.   EVALUATION If hemorrhoids are still causing problems, you could benefit by an evaluation by a surgeon.  The surgeon will obtain a history and examine you.  If hemorrhoids are diagnosed, some therapies can be offered in the office, usually with an anoscope into the less sensitive area of the rectum: -injection of hemorrhoids (sclerotherapy) can scar the blood vessels of the swollen/enlarged hemorrhoids to help shrink them down to a more normal  size -rubber banding of the enlarged hemorrhoids to help shrink them down to a more normal size -drainage of the blood clot causing a thrombosed hemorrhoid,  to relieve the severe pain   While 90% of the time such problems from hemorrhoids can be managed without preceding to surgery, sometimes the hemorrhoids require a operation to control the problem (uncontrolled bleeding, prolapse, pain, etc.).   This involves being placed under general anesthesia where the surgeon can confirm the diagnosis and remove, suture, or staple the hemorrhoid(s).  Your surgeon can help you treat the problem appropriately.    
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
## 2013-12-26 ENCOUNTER — Encounter (INDEPENDENT_AMBULATORY_CARE_PROVIDER_SITE_OTHER): Payer: Self-pay | Admitting: Surgery

## 2013-12-26 ENCOUNTER — Ambulatory Visit (INDEPENDENT_AMBULATORY_CARE_PROVIDER_SITE_OTHER): Payer: Medicaid Other | Admitting: Surgery

## 2013-12-26 VITALS — BP 130/74 | HR 74 | Temp 98.5°F | Ht 63.0 in | Wt 129.0 lb

## 2013-12-26 DIAGNOSIS — K648 Other hemorrhoids: Secondary | ICD-10-CM

## 2013-12-26 NOTE — Progress Notes (Signed)
Subjective:     Patient ID: Jamie Sweeney, female   DOB: Jul 13, 1966, 48 y.o.   MRN: [id number]  HPI   Jamie Sweeney  [birth year] [id number]  Patient Care Team: Mardelle Matte as PCP - General Vertell Novak., MD as Consulting Physician (Gastroenterology)  This patient is a 48 y.o.female who presents today for surgical evaluation.   Reason for visit: Rectal bleeding.  Worsening hemorrhoids.  Pleasant woman.  Noticed with her fourth pregnancy some rectal bleeding.  Sigmoidoscopy done.  Found to have internal hemorrhoids.  Has had a few more children.  Mild intermittent rectal bleeding.  Had episode of chest pain and choking episode.  Cardiac workup negative.  EGD done.  Had colonoscopy.  No other abnormalities except for moderate internal hemorrhoids.    Sent to me for evaluation 2013.   I banded her back in September 2013.  The rectal discomfort gradually went away.  Then she had an episode of rectal bleeding that was rather severe.  Concerned her.  I banded the hemorrhoids again last year.  That seemed to help for a while then she began to have some hemorrhoid discomfort.  It was tolerable until this year.  Tylenol and ibuprofen have not been enough.  Some spasming.  She has been using hemorrhoid ointments without much help.  She has been compliant on MiraLAX.  Does help her bowel movements to be every day.  Occasionally they are loose.  She does take omeprazole that is controlling her reflux better but has not controlled her diarrhea.  She does get burning and itching.  She often feels pressure when she has bowel movements.  More bleeding now. Still has a lot of stress in her family life but that is more manageable now.  She was concerned.  She returns for treatment.    Patient Active Problem List   Diagnosis Date Noted  . Internal hemorrhoids with bleeding 04/10/2012  . GERD (gastroesophageal reflux disease)     Past Medical History  Diagnosis Date  . Asthma   . Migraine  headache   . Reflux   . GERD (gastroesophageal reflux disease)   . Internal hemorrhoids with bleeding 04/10/2012    Past Surgical History  Procedure Laterality Date  . Vulvoplasty  1998  . Hemangioma excision  1998    right hand  . Shoulder surgery  2009    right  . Knee surgery  2009    right  . Foot surgery  2012    right plantar  . Nasal septum surgery    . Hemorrhoid banding      History   Social History  . Marital Status: Legally Separated    Spouse Name: N/A    Number of Children: N/A  . Years of Education: N/A   Occupational History  . Not on file.   Social History Main Topics  . Smoking status: Former Smoker    Quit date: 03/13/1995  . Smokeless tobacco: Not on file  . Alcohol Use: Yes     Comment: once every 3-4 months  . Drug Use: No  . Sexual Activity: Not on file   Other Topics Concern  . Not on file   Social History Narrative  . No narrative on file    Family History  Problem Relation Age of Onset  . Prostate cancer Father   . Cancer Father     prostate/ skin  . Liver cancer Paternal Grandfather   . Cancer Paternal Grandfather  lung  . Cancer Paternal Aunt     breast  . Cancer Paternal Grandmother     breast  . Cancer Maternal Uncle     throat/skin    Current Outpatient Prescriptions  Medication Sig Dispense Refill  . albuterol (PROVENTIL) (2.5 MG/3ML) 0.083% nebulizer solution Take 2.5 mg by nebulization every 6 (six) hours as needed.      Marland Kitchen azelastine (ASTELIN) 137 MCG/SPRAY nasal spray Place 1 spray into the nose 2 (two) times daily. Use in each nostril as directed      . cetirizine (ZYRTEC) 10 MG tablet Take 10 mg by mouth daily.      . fexofenadine (ALLEGRA) 180 MG tablet Take 180 mg by mouth daily.      . fluticasone (VERAMYST) 27.5 MCG/SPRAY nasal spray Place 2 sprays into the nose daily.      Marland Kitchen ketotifen (ZADITOR) 0.025 % ophthalmic solution 1 drop 2 (two) times daily.      . montelukast (SINGULAIR) 10 MG tablet Take 10 mg  by mouth at bedtime.      . Multiple Vitamin (MULTIVITAMIN) tablet Take 1 tablet by mouth daily.      . Olopatadine HCl (PATADAY) 0.2 % SOLN Apply to eye.      Marland Kitchen omeprazole (PRILOSEC) 40 MG capsule Take 40 mg by mouth daily.      . Phentermine-Topiramate (QSYMIA) 7.5-46 MG CP24 Take by mouth daily.      Marland Kitchen topiramate (TOPAMAX) 50 MG tablet Take 50 mg by mouth 2 (two) times daily.      . traZODone (DESYREL) 50 MG tablet Take 50 mg by mouth at bedtime.       No current facility-administered medications for this visit.     Allergies  Allergen Reactions  . Diflucan [Fluconazole] Hives and Rash  . Sulfa Antibiotics Rash    BP 130/74  Pulse 74  Temp(Src) 98.5 F (36.9 C)  Ht 5\' 3"  (1.6 m)  Wt 129 lb (58.514 kg)  BMI 22.86 kg/m2  No results found.   Review of Systems  Constitutional: Negative for fever, chills, diaphoresis, appetite change and fatigue.  HENT: Negative for ear discharge, ear pain, sore throat and trouble swallowing.   Eyes: Negative for photophobia, discharge and visual disturbance.  Respiratory: Negative for cough, choking, chest tightness and shortness of breath.   Cardiovascular: Negative for chest pain and palpitations.  Gastrointestinal: Positive for diarrhea, anal bleeding and rectal pain. Negative for nausea, vomiting, abdominal pain and constipation.  Genitourinary: Negative for dysuria, frequency and difficulty urinating.  Musculoskeletal: Negative for gait problem, myalgias and neck pain.  Skin: Negative for color change, pallor and rash.  Neurological: Negative for dizziness, speech difficulty, weakness and numbness.  Hematological: Negative for adenopathy.  Psychiatric/Behavioral: Negative for confusion and agitation. The patient is not nervous/anxious.        Objective:   Physical Exam  Constitutional: She is oriented to person, place, and time. She appears well-developed and well-nourished. No distress.  HENT:  Head: Normocephalic.  Mouth/Throat:  Oropharynx is clear and moist. No oropharyngeal exudate.  Eyes: Conjunctivae and EOM are normal. Pupils are equal, round, and reactive to light. No scleral icterus.  Neck: Normal range of motion. Neck supple. No tracheal deviation present.  Cardiovascular: Normal rate, regular rhythm and intact distal pulses.   Pulmonary/Chest: Effort normal and breath sounds normal. No respiratory distress. She exhibits no tenderness.  Abdominal: Soft. She exhibits no distension and no mass. There is no tenderness. Hernia confirmed negative in  the right inguinal area and confirmed negative in the left inguinal area.  Genitourinary: Rectal exam shows external hemorrhoid and internal hemorrhoid. Rectal exam shows no fissure. No vaginal discharge found.  Exam done with assistance of female Medical Assistant in the room. Perianal skin clean with good hygiene.  No pruritis.   No pilonidal disease.  No fissure.  No abscess/fistula.  Tolerates digital and anoscopic rectal exam.  Increased sphincter tone.  No rectal masses.    Hemorrhoidal piles moderately enlarged with moderate R ant hem prolapse/ext hem component.  L lateral larger  Musculoskeletal: Normal range of motion. She exhibits no tenderness.  Lymphadenopathy:    She has no cervical adenopathy.       Right: No inguinal adenopathy present.       Left: No inguinal adenopathy present.  Neurological: She is alert and oriented to person, place, and time. No cranial nerve deficit. She exhibits normal muscle tone. Coordination normal.  Skin: Skin is warm and dry. No rash noted. She is not diaphoretic. No erythema.  Psychiatric: She has a normal mood and affect. Her behavior is normal. Judgment and thought content normal.       Assessment:     Int hemorrhoids with bleeding despite better BMs on fiber bowel regimen & banding x2    Plan:     Because she has some chronic prolapse now and has failed to banding, I feel she would benefit from surgery.  Would plan THD  hemorrhoidal suturing/pexy.  Then resect any persistent tissue.  She agrees:  The anatomy & physiology of the anorectal region was discussed.  The pathophysiology of hemorrhoids and differential diagnosis was discussed.  Natural history risks without surgery was discussed.   I stressed the importance of a bowel regimen to have daily soft bowel movements to minimize progression of disease.  Interventions such as sclerotherapy & banding were discussed.  The patient's symptoms are not adequately controlled by medicines and other non-operative treatments.  I feel the risks & problems of no surgery outweigh the operative risks; therefore, I recommended surgery to treat the hemorrhoids by ligation, pexy, and possible resection.  Risks such as bleeding, infection, urinary difficulties, need for further treatment, heart attack, death, and other risks were discussed.   I noted a good likelihood this will help address the problem.  Goals of post-operative recovery were discussed as well.  Possibility that this will not correct all symptoms was explained.  Post-operative pain, bleeding, constipation, and other problems after surgery were discussed.  We will work to minimize complications.   Educational handouts further explaining the pathology, treatment options, and bowel regimen were given as well.  Questions were answered.  The patient expresses understanding & wishes to proceed with surgery.  I recommend to back off on the MiraLAX to somewhere between a half to two thirds dose a day until she is having one soft bowel movement a day.  I think the intermittent diarrhea implies that she is overshooting.

## 2013-12-26 NOTE — Patient Instructions (Signed)
Please consider the recommendations that we have given you today:  Consider surgery to ligate and possibly remove persistent hemorrhoids.  See the Handout(s) we have given you.  Please call our office at [phone] if you wish to schedule surgery or if you have further questions / concerns.    [facility] PREP INSTRUCTIONS for Anal/Rectal Surgery:   Obtain what you need at a pharmacy of your choice:      A bottle of Milk of Magnesia   DAY PRIOR TO SURGERY:    Switch to drinking liquids or pureed foods only    1:00pm    o Take 2 oz (4 tablespoons) Milk of Magnesia.     Midnight:  Do not eat or drink anything after midnight the night before your surgery.   MORNING OF PROCEDURE:    Remember to not to drink or eat anything that morning    If you have questions or problems, please call [facility] [phone] speak to someone in the clinic department at our office    ANORECTAL SURGERY: POST OP INSTRUCTIONS  1. Take your usually prescribed home medications unless otherwise directed. 2. DIET: Follow a light bland diet the first 24 hours after arrival home, such as soup, liquids, crackers, etc.  Be sure to include lots of fluids daily.  Avoid fast food or heavy meals as your are more likely to get nauseated.  Eat a low fat the next few days after surgery.   3. PAIN CONTROL: a. Pain is best controlled by a usual combination of three different methods TOGETHER: i. Ice/Heat ii. Over the counter pain medication iii. Prescription pain medication b. Most patients will experience some swelling and discomfort in the anus/rectal area. and incisions.  Ice packs or heat (30-60 minutes up to 6 times a day) will help. Use ice for the first few days to help decrease swelling and bruising, then switch to heat such as warm towels, sitz baths, warm baths, etc to help relax tight/sore spots and speed recovery.  Some people prefer to use ice alone, heat alone, alternating between ice &  heat.  Experiment to what works for you.  Swelling and bruising can take several weeks to resolve.   c. It is helpful to take an over-the-counter pain medication regularly for the first few weeks.  Choose one of the following that works best for you: i. Naproxen (Aleve, etc)  Two 220mg  tabs twice a day ii. Ibuprofen (Advil, etc) Three 200mg  tabs four times a day (every meal & bedtime) iii. Acetaminophen (Tylenol, etc) 500-650mg  four times a day (every meal & bedtime) d. A  prescription for pain medication (such as oxycodone, hydrocodone, etc) should be given to you upon discharge.  Take your pain medication as prescribed.  i. If you are having problems/concerns with the prescription medicine (does not control pain, nausea, vomiting, rash, itching, etc), please call us [phone] to see if we need to switch you to a different pain medicine that will work better for you and/or control your side effect better. ii. If you need a refill on your pain medication, please contact your pharmacy.  They will contact our office to request authorization. Prescriptions will not be filled after 5 pm or on week-ends.  Use a Sitz Bath 4-8 times a day for relief A sitz bath is a warm water bath taken in the sitting position that covers only the hips and buttocks. It may be used for either healing or hygiene purposes. Sitz baths  are also used to relieve pain, itching, or muscle spasms. The water may contain medicine. Moist heat will help you heal and relax.  HOME CARE INSTRUCTIONS  Take 3 to 4 sitz baths a day. 1. Fill the bathtub half full with warm water. 2. Sit in the water and open the drain a little. 3. Turn on the warm water to keep the tub half full. Keep the water running constantly. 4. Soak in the water for 15 to 20 minutes. 5. After the sitz bath, pat the affected area dry first. SEEK MEDICAL CARE IF:  You get worse instead of better. Stop the sitz baths if you get worse.   4. KEEP YOUR BOWELS  REGULAR a. The goal is one bowel movement a day b. Avoid getting constipated.  Between the surgery and the pain medications, it is common to experience some constipation.  Increasing fluid intake and taking a fiber supplement (such as Metamucil, Citrucel, FiberCon, MiraLax, etc) 1-2 times a day regularly will usually help prevent this problem from occurring.  A mild laxative (prune juice, Milk of Magnesia, MiraLax, etc) should be taken according to package directions if there are no bowel movements after 48 hours. c. Watch out for diarrhea.  If you have many loose bowel movements, simplify your diet to bland foods & liquids for a few days.  Stop any stool softeners and decrease your fiber supplement.  Switching to mild anti-diarrheal medications (Kayopectate, Pepto Bismol) can help.  If this worsens or does not improve, please call us.  5. Wound Care a. Remove your bandages the day after surgery.  Unless discharge instructions indicate otherwise, leave your bandage dry and in place overnight.  Remove the bandage during your first bowel movement.   b. Allow the wound packing to fall out over the next few days.  You can trim exposed gauze / ribbon as it falls out.  You do not need to repack the wound unless instructed otherwise.  Wear an absorbent pad or soft cotton gauze in your underwear as needed to catch any drainage and help keep the area  c. Keep the area clean and dry.  Bathe / shower every day.  Keep the area clean by showering / bathing over the incision / wound.   It is okay to soak an open wound to help wash it.  Wet wipes or showers / gentle washing after bowel movements is often less traumatic than regular toilet paper. d. Bonita Quin may have some styrofoam-like soft packing in the rectum which will come out with the first bowel movement.  e. You will often notice bleeding with bowel movements.  This should slow down by the end of the first week of surgery f. Expect some drainage.  This should slow  down, too, by the end of the first week of surgery.  Wear an absorbent pad or soft cotton gauze in your underwear until the drainage stops. 6. ACTIVITIES as tolerated:   a. You may resume regular (light) daily activities beginning the next day-such as daily self-care, walking, climbing stairs-gradually increasing activities as tolerated.  If you can walk 30 minutes without difficulty, it is safe to try more intense activity such as jogging, treadmill, bicycling, low-impact aerobics, swimming, etc. b. Save the most intensive and strenuous activity for last such as sit-ups, heavy lifting, contact sports, etc  Refrain from any heavy lifting or straining until you are off narcotics for pain control.   c. DO NOT PUSH THROUGH PAIN.  Let pain be  your guide: If it hurts to do something, don't do it.  Pain is your body warning you to avoid that activity for another week until the pain goes down. d. You may drive when you are no longer taking prescription pain medication, you can comfortably sit for long periods of time, and you can safely maneuver your car and apply brakes. e. Bonita Quin may have sexual intercourse when it is comfortable.  7. FOLLOW UP in our office a. Please call [facility] at [phone] to set up an appointment to see your surgeon in the office for a follow-up appointment approximately 2 weeks after your surgery. b. Make sure that you call for this appointment the day you arrive home to insure a convenient appointment time. 10. IF YOU HAVE DISABILITY OR FAMILY LEAVE FORMS, BRING THEM TO THE OFFICE FOR PROCESSING.  DO NOT GIVE THEM TO YOUR DOCTOR.        WHEN TO CALL us [phone]: 1. Poor pain control 2. Reactions / problems with new medications (rash/itching, nausea, etc)  3. Fever over 101.5 F (38.5 C) 4. Inability to urinate 5. Nausea and/or vomiting 6. Worsening swelling or bruising 7. Continued bleeding from incision. 8. Increased pain, redness, or drainage from the incision  The  clinic staff is available to answer your questions during regular business hours (8:30am-5pm).  Please don't hesitate to call and ask to speak to one of our nurses for clinical concerns.   A surgeon from [facility] is always on call at the hospitals   If you have a medical emergency, go to the nearest emergency room or call 911.    [facility] [address], [address], [place], [place]  [address] ? MAIN: [phone] ? TOLL FREE: [phone] ? FAX [phone] [email or web address]  HEMORRHOIDS  The rectum is the last foot of your colon, and it naturally stretches to hold stool.  Hemorrhoidal piles are natural clusters of blood vessels that help the rectum and anal canal stretch to hold stool and allow bowel movements to eliminate feces.   Hemorrhoids are abnormally swollen blood vessels in the rectum.  Too much pressure in the rectum causes hemorrhoids by forcing blood to stretch and bulge the walls of the veins, sometimes even rupturing them.  Hemorrhoids can become like varicose veins you might see on a person's legs.  Most people will develop a flare of hemorrhoids in their lifetime.  When bulging hemorrhoidal veins are irritated, they can swell, burn, itch, cause pain, and bleed.  Most flares will calm down gradually own within a few weeks.  However, once hemorrhoids are created, they are difficult to get rid of completely and tend to flare more easily than the first flare.   Fortunately, good habits and simple medical treatment usually control hemorrhoids well, and surgery is needed only in severe cases. Types of Hemorrhoids:  Internal hemorrhoids usually don't initially hurt or itch; they are deep inside the rectum and usually have no sensation. If they begin to push out (prolapse), pain and burning can occur.  However, internal hemorrhoids can bleed.  Anal bleeding should not be ignored since bleeding could come from a dangerous source like  colorectal cancer, so persistent rectal bleeding should be investigated by a doctor, sometimes with a colonoscopy.  External hemorrhoids cause most of the symptoms - pain, burning, and itching. Nonirritated hemorrhoids can look like small skin tags coming out of the anus.   Thrombosed hemorrhoids can form when a hemorrhoid  blood vessel bursts and causes the hemorrhoid to suddenly swell.  A purple blood clot can form in it and become an excruciatingly painful lump at the anus. Because of these unpleasant symptoms, immediate incision and drainage by a surgeon at an office visit can provide much relief of the pain.    PREVENTION Avoiding the most frequent causes listed below will prevent most cases of hemorrhoids: Constipation Hard stools Diarrhea  Constant sitting  Straining with bowel movements Sitting on the toilet for a long time  Severe coughing  episodes Pregnancy / Childbirth  Heavy Lifting  Sometimes avoiding the above triggers is difficult:  How can you avoid sitting all day if you have a seated job? Also, we try to avoid coughing and diarrhea, but sometimes it's beyond your control.  Still, there are some practical hints to help: Keep the anal and genital area clean.  Moistened tissues such as flushable wet wipes are less irritating than toilet paper.  Using irrigating showers or bottle irrigation washing gently cleans this sensitive area.   Avoid dry toilet paper when cleaning after bowel movements.  Marland Kitchen. Keep the anal and genital area dry.  Lightly pat the rectal area dry.  Avoid rubbing.  Talcum or baby powders can help GET YOUR STOOLS SOFT.   This is the most important way to prevent irritated hemorrhoids.  Hard stools are like sandpaper to the anorectal canal and will cause more problems.  The goal: ONE SOFT BOWEL MOVEMENT A DAY!  BMs from every other day to 3 times a day is a tolerable range Treat coughing, diarrhea and constipation early since irritated hemorrhoids may soon follow.  If  your main job activity is seated, always stand or walk during your breaks. Make it a point to stand and walk at least 5 minutes every hour and try to shift frequently in your chair to avoid direct rectal pressure.  Always exhale as you strain or lift. Don't hold your breath.  Do not delay or try to prevent a bowel movement when the urge is present. Exercise regularly (walking or jogging 60 minutes a day) to stimulate the bowels to move. No reading or other activity while on the toilet. If bowel movements take longer than 5 minutes, you are too constipated. AVOID CONSTIPATION Drink plenty of liquids (1 1/2 to 2 quarts of water and other fluids a day unless fluid restricted for another medical condition). Liquids that contain caffeine (coffee a, tea, soft drinks) can be dehydrating and should be avoided until constipation is controlled. Consider minimizing milk, as dairy products may be constipating. Eat plenty of fiber (30g a day ideal, more if needed).  Fiber is the undigested part of plant food that passes into the colon, acting as "natures broom" to encourage bowel motility and movement.  Fiber can absorb and hold large amounts of water. This results in a larger, bulkier stool, which is soft and easier to pass.  Eating foods high in fiber - 12 servings - such as  Vegetables: Root (potatoes, carrots, turnips), Leafy green (lettuce, salad greens, celery, spinach), High residue (cabbage, broccoli, etc.) Fruit: Fresh, Dried (prunes, apricots, cherries), Stewed (applesauce)  Whole grain breads, pasta, whole wheat Bran cereals, muffins, etc. Consider adding supplemental bulking fiber which retains large volumes of water: Psyllium ground seeds (native plant from [place])--available as Metamucil, Konsyl, Effersyllium, Per Diem Fiber, or the less expensive generic forms.  Citrucel  (methylcellulose wood fiber) . FiberCon (Polycarbophil) Polyethylene Glycol - and "artificial" fiber commonly called  Miralax or Glycolax.  It is helpful for people with gassy or bloated feelings with regular fiber Flax Seed - a less gassy natural fiber  Laxatives can be useful for a short period if constipation is severe Osmotics (Milk of Magnesia, Fleets Phospho-Soda, Magnesium Citrate)  Stimulants (Senokot,   Castor Oil,  Dulcolax, Ex-Lax)    Laxatives are not a good long-term solution as it can stress the bowels and cause too much mineral loss and dehydration.   Avoid taking laxatives for more than 7 days in a row.  AVOID DIARRHEA Switch to liquids and simpler foods for a few days to avoid stressing your intestines further. Avoid dairy products (especially milk & ice cream) for a short time.  The intestines often can lose the ability to digest lactose when stressed. Avoid foods that cause gassiness or bloating.  Typical foods include beans and other legumes, cabbage, broccoli, and dairy foods.  Every person has some sensitivity to other foods, so listen to your body and avoid those foods that trigger problems for you. Adding fiber (Citrucel, Metamucil, FiberCon, Flax seed, Miralax) gradually can help thicken stools by absorbing excess fluid and retrain the intestines to act more normally.  Slowly increase the dose over a few weeks.  Too much fiber too soon can backfire and cause cramping & bloating. Probiotics (such as active yogurt, Align, etc) may help repopulate the intestines and colon with normal bacteria and calm down a sensitive digestive tract.  Most studies show it to be of mild help, though, and such products can be costly. Medicines: Bismuth subsalicylate (ex. Kayopectate, Pepto Bismol) every 30 minutes for up to 6 doses can help control diarrhea.  Avoid if pregnant. Loperamide (Immodium) can slow down diarrhea.  Start with two tablets (4mg  total) first and then try one tablet every 6 hours.  Avoid if you are having fevers or severe pain.  If you are not better or start feeling worse, stop all  medicines and call your doctor for advice Call your doctor if you are getting worse or not better.  Sometimes further testing (cultures, endoscopy, X-ray studies, bloodwork, etc) may be needed to help diagnose and treat the cause of the diarrhea. TREATMENT OF HEMORRHOID FLARE If these preventive measures fail, you must take action right away! Hemorrhoids are one condition that can be mild in the morning and become intolerable by nightfall. Most hemorrhoidal flares take several weeks to calm down.  These suggestions can help: Warm soaks.  This helps more than any topical medication.  Use up to 8 times a day.  Usually sitz baths or sitting in a warm bathtub helps.  Sitting on moist warm towels are helpful.  Switching to ice packs/cool compresses can be helpful  Use a Sitz Bath 4-8 times a day for relief A sitz bath is a warm water bath taken in the sitting position that covers only the hips and buttocks. It may be used for either healing or hygiene purposes. Sitz baths are also used to relieve pain, itching, or muscle spasms. The water may contain medicine. Moist heat will help you heal and relax.  HOME CARE INSTRUCTIONS  Take 3 to 4 sitz baths a day. 6. Fill the bathtub half full with warm water. 7. Sit in the water and open the drain a little. 8. Turn on the warm water to keep the tub half full. Keep the water running constantly. 9. Soak in the water for 15 to 20 minutes. 10. After the sitz bath, pat  the affected area dry first. SEEK MEDICAL CARE IF:  You get worse instead of better. Stop the sitz baths if you get worse.  Normalize your bowels.  Extremes of diarrhea or constipation will make hemorrhoids worse.  One soft bowel movement a day is the goal.  Fiber can help get your bowels regular Wet wipes instead of toilet paper Pain control with a NSAID such as ibuprofen (Advil) or naproxen (Aleve) or acetaminophen (Tylenol) around the clock.  Narcotics are constipating and should be minimized if  possible Topical creams contain steroids (bydrocortisone) or local anesthetic (xylocaine) can help make pain and itching more tolerable.   EVALUATION If hemorrhoids are still causing problems, you could benefit by an evaluation by a surgeon.  The surgeon will obtain a history and examine you.  If hemorrhoids are diagnosed, some therapies can be offered in the office, usually with an anoscope into the less sensitive area of the rectum: -injection of hemorrhoids (sclerotherapy) can scar the blood vessels of the swollen/enlarged hemorrhoids to help shrink them down to a more normal size -rubber banding of the enlarged hemorrhoids to help shrink them down to a more normal size -drainage of the blood clot causing a thrombosed hemorrhoid,  to relieve the severe pain   While 90% of the time such problems from hemorrhoids can be managed without preceding to surgery, sometimes the hemorrhoids require a operation to control the problem (uncontrolled bleeding, prolapse, pain, etc.).   This involves being placed under general anesthesia where the surgeon can confirm the diagnosis and remove, suture, or staple the hemorrhoid(s).  Your surgeon can help you treat the problem appropriately.

## 2014-01-07 ENCOUNTER — Telehealth (INDEPENDENT_AMBULATORY_CARE_PROVIDER_SITE_OTHER): Payer: Self-pay | Admitting: Surgery

## 2014-01-07 NOTE — Telephone Encounter (Signed)
Called patient on 12/30/13 to schedule surgery, patient never returned my call.

## 2014-01-07 NOTE — Telephone Encounter (Signed)
Hopefully she will call at some point

## 2014-02-27 ENCOUNTER — Telehealth (INDEPENDENT_AMBULATORY_CARE_PROVIDER_SITE_OTHER): Payer: Self-pay

## 2014-02-27 NOTE — Telephone Encounter (Signed)
LMOM for pt to call me. Debbie our [facility] scheduler said to call the pt she had questions about her med's before sx.

## 2014-03-02 NOTE — Telephone Encounter (Signed)
Pt called back for me to ask Dr Michaell CowingGross about her taking her migraine medication up until the surgery. The pt takes Topamax 50mg  in am and Topamax 100mg  in pm daily. Preop wanted pt to call us to see what our recommendations were for the sx b/c they told pt that anesthesia has problems with pt's taking this before surgery. I advised pt that I would send message to Dr Michaell CowingGross and call her back tomorrow. Pt understands.

## 2014-03-03 ENCOUNTER — Encounter (HOSPITAL_COMMUNITY): Payer: Self-pay | Admitting: Pharmacy Technician

## 2014-03-03 NOTE — Telephone Encounter (Signed)
She take her medications as tolerated.  If anesthesia has restrictions, have her call them for their advice

## 2014-03-06 ENCOUNTER — Encounter (HOSPITAL_COMMUNITY): Payer: Self-pay

## 2014-03-06 ENCOUNTER — Other Ambulatory Visit: Payer: Self-pay

## 2014-03-06 ENCOUNTER — Encounter (HOSPITAL_COMMUNITY)
Admission: RE | Admit: 2014-03-06 | Discharge: 2014-03-06 | Disposition: A | Discharge status: Home / Self Care | Payer: Medicaid Other | Source: Ambulatory Visit | Attending: Surgery | Admitting: Surgery

## 2014-03-06 ENCOUNTER — Ambulatory Visit (HOSPITAL_COMMUNITY)
Admission: RE | Admit: 2014-03-06 | Discharge: 2014-03-06 | Disposition: A | Discharge status: Home / Self Care | Payer: Medicaid Other | Source: Ambulatory Visit | Attending: Anesthesiology | Admitting: Anesthesiology

## 2014-03-06 DIAGNOSIS — Z01818 Encounter for other preprocedural examination: Secondary | ICD-10-CM | POA: Insufficient documentation

## 2014-03-06 DIAGNOSIS — Z01810 Encounter for preprocedural cardiovascular examination: Secondary | ICD-10-CM | POA: Diagnosis not present

## 2014-03-06 DIAGNOSIS — Z01812 Encounter for preprocedural laboratory examination: Secondary | ICD-10-CM | POA: Insufficient documentation

## 2014-03-06 HISTORY — DX: Other specified postprocedural states: Z98.890

## 2014-03-06 HISTORY — DX: Anxiety disorder, unspecified: F41.9

## 2014-03-06 HISTORY — DX: Major depressive disorder, single episode, unspecified: F32.9

## 2014-03-06 HISTORY — DX: Other complications of anesthesia, initial encounter: T88.59XA

## 2014-03-06 HISTORY — DX: Adverse effect of unspecified anesthetic, initial encounter: T41.45XA

## 2014-03-06 HISTORY — DX: Unspecified convulsions: R56.9

## 2014-03-06 HISTORY — DX: Asymptomatic varicose veins of unspecified lower extremity: I83.90

## 2014-03-06 HISTORY — DX: Unspecified chronic bronchitis: J42

## 2014-03-06 HISTORY — DX: Depression, unspecified: F32.A

## 2014-03-06 HISTORY — DX: Other specified postprocedural states: R11.2

## 2014-03-06 LAB — CBC
HCT: 43.6 % (ref 36.0–46.0)
HEMOGLOBIN: 15.2 g/dL — AB (ref 12.0–15.0)
MCH: 32.1 pg (ref 26.0–34.0)
MCHC: 34.9 g/dL (ref 30.0–36.0)
MCV: 92 fL (ref 78.0–100.0)
Platelets: 222 10*3/uL (ref 150–400)
RBC: 4.74 MIL/uL (ref 3.87–5.11)
RDW: 12.1 % (ref 11.5–15.5)
WBC: 4 10*3/uL (ref 4.0–10.5)

## 2014-03-06 LAB — BASIC METABOLIC PANEL
Anion gap: 10 (ref 5–15)
BUN: 11 mg/dL (ref 6–23)
CALCIUM: 9.6 mg/dL (ref 8.4–10.5)
CO2: 24 meq/L (ref 19–32)
Chloride: 106 mEq/L (ref 96–112)
Creatinine, Ser: 0.74 mg/dL (ref 0.50–1.10)
GFR calc Af Amer: >90 mL/min (ref >90)
GFR calc non Af Amer: >90 mL/min (ref >90)
Glucose, Bld: 83 mg/dL (ref 70–99)
Potassium: 3.8 mEq/L (ref 3.7–5.3)
SODIUM: 140 meq/L (ref 137–147)

## 2014-03-06 LAB — HCG, SERUM, QUALITATIVE: PREG SERUM: NEGATIVE

## 2014-03-06 NOTE — Patient Instructions (Addendum)
22 Jamie Sweeney  03/06/2014   Your procedure is scheduled on: Thursday 03/12/14  [facility] at 09:30 AM.  Call this number if you have problems the morning of surgery 336-: [phone]   Remember: please bring inhaler on day of surgery   Do not eat food or drink liquids After Midnight.     Take these medicines the morning of surgery with A SIP OF WATER: inhaler if needed, allegra, zyrtec   Do not wear jewelry, make-up or nail polish.  Do not wear lotions, powders, or perfumes. You may wear deodorant.  Do not shave 48 hours prior to surgery. Men may shave face and neck.  Do not bring valuables to the hospital.  Contacts, dentures or bridgework may not be worn into surgery.     Patients discharged the day of surgery will not be allowed to drive home.  Name and phone number of your driver: Cato MulliganLatta,Katie Daughter [phone] or Latta,Chrissy Daughter [phone]    Birdie Sonsachel Feven Alderfer, RN  pre op nurse call if needed [phone]    [facility] - Preparing [facility] Before surgery, you can play an important role.  Because skin is not sterile, your skin needs to be as free of germs as possible.  You can reduce the number of germs on your skin by washing with CHG (chlorahexidine gluconate) soap before surgery.  CHG is an antiseptic cleaner which kills germs and bonds with the skin to continue killing germs even after washing. Please DO NOT use if you have an allergy to CHG or antibacterial soaps.  If your skin becomes reddened/irritated stop using the CHG and inform your nurse when you arrive at Short Stay. Do not shave (including legs and underarms) for at least 48 hours prior to the first CHG shower.  You may shave your face/neck. Please follow these instructions carefully:  1.  Shower with CHG Soap the night before surgery and the  morning of Surgery.  2.  If you choose to wash your hair, wash your hair first as usual with your  normal  shampoo.  3.  After you  shampoo, rinse your hair and body thoroughly to remove the  shampoo.                            4.  Use CHG as you would any other liquid soap.  You can apply chg directly  to the skin and wash                       Gently with a scrungie or clean washcloth.  5.  Apply the CHG Soap to your body ONLY FROM THE NECK DOWN.   Do not use on face/ open                           Wound or open sores. Avoid contact with eyes, ears mouth and genitals (private parts).                       Wash face,  Genitals (private parts) with your normal soap.             6.  Wash thoroughly, paying special attention to the area where your surgery  will be performed.  7.  Thoroughly rinse your body with warm water from the neck down.  8.  DO NOT  shower/wash with your normal soap after using and rinsing off  the CHG Soap.                9.  Pat yourself dry with a clean towel.            10.  Wear clean pajamas.            11.  Place clean sheets on your bed the night of your first shower and do not  sleep with pets. Day of Surgery : Do not apply any lotions/deodorants the morning of surgery.  Please wear clean clothes to the hospital/surgery center.  FAILURE TO FOLLOW THESE INSTRUCTIONS MAY RESULT IN THE CANCELLATION OF YOUR SURGERY PATIENT SIGNATURE_________________________________  NURSE SIGNATURE__________________________________  ________________________________________________________________________

## 2014-03-12 ENCOUNTER — Ambulatory Visit (HOSPITAL_COMMUNITY)
Admission: RE | Admit: 2014-03-12 | Discharge: 2014-03-12 | Disposition: A | Discharge status: Home / Self Care | Payer: Medicaid Other | Source: Ambulatory Visit | Attending: Surgery | Admitting: Surgery

## 2014-03-12 ENCOUNTER — Encounter (HOSPITAL_COMMUNITY): Payer: Medicaid Other | Admitting: Registered Nurse

## 2014-03-12 ENCOUNTER — Encounter (HOSPITAL_COMMUNITY): Payer: Self-pay | Admitting: *Deleted

## 2014-03-12 ENCOUNTER — Ambulatory Visit (HOSPITAL_COMMUNITY): Payer: Medicaid Other | Admitting: Registered Nurse

## 2014-03-12 ENCOUNTER — Encounter (HOSPITAL_COMMUNITY)
Admission: RE | Disposition: A | Discharge status: Home / Self Care | Payer: Self-pay | Source: Ambulatory Visit | Attending: Surgery

## 2014-03-12 DIAGNOSIS — K644 Residual hemorrhoidal skin tags: Secondary | ICD-10-CM | POA: Insufficient documentation

## 2014-03-12 DIAGNOSIS — F411 Generalized anxiety disorder: Secondary | ICD-10-CM | POA: Diagnosis not present

## 2014-03-12 DIAGNOSIS — Z888 Allergy status to other drugs, medicaments and biological substances status: Secondary | ICD-10-CM | POA: Insufficient documentation

## 2014-03-12 DIAGNOSIS — K648 Other hemorrhoids: Secondary | ICD-10-CM | POA: Insufficient documentation

## 2014-03-12 DIAGNOSIS — F329 Major depressive disorder, single episode, unspecified: Secondary | ICD-10-CM | POA: Diagnosis not present

## 2014-03-12 DIAGNOSIS — G43909 Migraine, unspecified, not intractable, without status migrainosus: Secondary | ICD-10-CM | POA: Insufficient documentation

## 2014-03-12 DIAGNOSIS — Z87891 Personal history of nicotine dependence: Secondary | ICD-10-CM | POA: Insufficient documentation

## 2014-03-12 DIAGNOSIS — J45909 Unspecified asthma, uncomplicated: Secondary | ICD-10-CM | POA: Diagnosis not present

## 2014-03-12 DIAGNOSIS — Z882 Allergy status to sulfonamides status: Secondary | ICD-10-CM | POA: Insufficient documentation

## 2014-03-12 DIAGNOSIS — F3289 Other specified depressive episodes: Secondary | ICD-10-CM | POA: Diagnosis not present

## 2014-03-12 DIAGNOSIS — K219 Gastro-esophageal reflux disease without esophagitis: Secondary | ICD-10-CM | POA: Diagnosis not present

## 2014-03-12 HISTORY — PX: TRANSANAL HEMORRHOIDAL DEARTERIALIZATION: SHX6136

## 2014-03-12 SURGERY — TRANSANAL HEMORRHOIDAL DEARTERIALIZATION
Anesthesia: General | Site: Rectum

## 2014-03-12 MED ORDER — SUCCINYLCHOLINE CHLORIDE 20 MG/ML IJ SOLN
INTRAMUSCULAR | Status: DC | PRN
  Administered 2014-03-12: 100 mg via INTRAVENOUS

## 2014-03-12 MED ORDER — DEXAMETHASONE SODIUM PHOSPHATE 10 MG/ML IJ SOLN
INTRAMUSCULAR | Status: DC | PRN
  Administered 2014-03-12: 10 mg via INTRAVENOUS

## 2014-03-12 MED ORDER — SODIUM CHLORIDE 0.9 % IJ SOLN
INTRAMUSCULAR | Status: DC | PRN
Start: 2014-03-12 — End: 2014-03-12
  Administered 2014-03-12: 10 mL via INTRAVENOUS

## 2014-03-12 MED ORDER — CEFAZOLIN SODIUM-DEXTROSE 2-3 GM-% IV SOLR
INTRAVENOUS | Status: AC
  Filled 2014-03-12: qty 50

## 2014-03-12 MED ORDER — PROPOFOL 10 MG/ML IV BOLUS
INTRAVENOUS | Status: DC | PRN
  Administered 2014-03-12: 130 mg via INTRAVENOUS

## 2014-03-12 MED ORDER — MIDAZOLAM HCL 5 MG/5ML IJ SOLN
INTRAMUSCULAR | Status: DC | PRN
  Administered 2014-03-12: 2 mg via INTRAVENOUS

## 2014-03-12 MED ORDER — OXYCODONE HCL 5 MG PO TABS
5.0000 mg | ORAL_TABLET | Freq: Once | ORAL | Status: AC | PRN
  Administered 2014-03-12: 5 mg via ORAL
  Filled 2014-03-12: qty 1

## 2014-03-12 MED ORDER — LIDOCAINE HCL (CARDIAC) 20 MG/ML IV SOLN
INTRAVENOUS | Status: AC
Start: 2014-03-12 — End: 2014-03-12
  Filled 2014-03-12: qty 5

## 2014-03-12 MED ORDER — HYDROMORPHONE HCL PF 1 MG/ML IJ SOLN
INTRAMUSCULAR | Status: AC
  Filled 2014-03-12: qty 1

## 2014-03-12 MED ORDER — LIDOCAINE HCL (CARDIAC) 20 MG/ML IV SOLN
INTRAVENOUS | Status: DC | PRN
  Administered 2014-03-12: 60 mg via INTRAVENOUS

## 2014-03-12 MED ORDER — METRONIDAZOLE IN NACL 5-0.79 MG/ML-% IV SOLN
INTRAVENOUS | Status: AC
  Filled 2014-03-12: qty 100

## 2014-03-12 MED ORDER — NAPROXEN 500 MG PO TABS: 500.0000 mg | ORAL_TABLET | Freq: Two times a day (BID) | ORAL | Status: DC

## 2014-03-12 MED ORDER — HYDROMORPHONE HCL PF 1 MG/ML IJ SOLN
0.2500 mg | INTRAMUSCULAR | Status: DC | PRN
  Administered 2014-03-12 (×4): 0.5 mg via INTRAVENOUS

## 2014-03-12 MED ORDER — BUPIVACAINE LIPOSOME 1.3 % IJ SUSP
20.0000 mL | INTRAMUSCULAR | Status: DC
  Filled 2014-03-12: qty 20

## 2014-03-12 MED ORDER — LACTATED RINGERS IV SOLN
INTRAVENOUS | Status: DC | PRN
  Administered 2014-03-12: 11:00:00 via INTRAVENOUS

## 2014-03-12 MED ORDER — OXYCODONE HCL 5 MG PO TABS: 5.0000 mg | ORAL_TABLET | ORAL | Status: DC | PRN

## 2014-03-12 MED ORDER — SCOPOLAMINE 1 MG/3DAYS TD PT72
MEDICATED_PATCH | TRANSDERMAL | Status: AC
  Filled 2014-03-12: qty 1

## 2014-03-12 MED ORDER — SODIUM CHLORIDE 0.9 % IJ SOLN
INTRAMUSCULAR | Status: AC
  Filled 2014-03-12: qty 10

## 2014-03-12 MED ORDER — ONDANSETRON HCL 4 MG/2ML IJ SOLN
INTRAMUSCULAR | Status: DC | PRN
  Administered 2014-03-12: 4 mg via INTRAVENOUS

## 2014-03-12 MED ORDER — OXYCODONE HCL 5 MG/5ML PO SOLN
5.0000 mg | Freq: Once | ORAL | Status: AC | PRN
  Filled 2014-03-12: qty 5

## 2014-03-12 MED ORDER — ONDANSETRON HCL 4 MG/2ML IJ SOLN
INTRAMUSCULAR | Status: AC
  Filled 2014-03-12: qty 2

## 2014-03-12 MED ORDER — PROMETHAZINE HCL 25 MG/ML IJ SOLN
6.2500 mg | INTRAMUSCULAR | Status: AC | PRN
  Administered 2014-03-12 (×2): 6.25 mg via INTRAVENOUS
  Filled 2014-03-12: qty 1

## 2014-03-12 MED ORDER — CEFAZOLIN SODIUM-DEXTROSE 2-3 GM-% IV SOLR
2.0000 g | INTRAVENOUS | Status: AC
  Administered 2014-03-12: 2 g via INTRAVENOUS

## 2014-03-12 MED ORDER — MEPERIDINE HCL 50 MG/ML IJ SOLN: 6.2500 mg | INTRAMUSCULAR | Status: DC | PRN

## 2014-03-12 MED ORDER — METRONIDAZOLE IN NACL 5-0.79 MG/ML-% IV SOLN
500.0000 mg | INTRAVENOUS | Status: AC
  Administered 2014-03-12: 500 mg via INTRAVENOUS
  Filled 2014-03-12: qty 100

## 2014-03-12 MED ORDER — SCOPOLAMINE 1 MG/3DAYS TD PT72
MEDICATED_PATCH | TRANSDERMAL | Status: DC | PRN
  Administered 2014-03-12: 1 via TRANSDERMAL

## 2014-03-12 MED ORDER — MIDAZOLAM HCL 2 MG/2ML IJ SOLN
INTRAMUSCULAR | Status: AC
  Filled 2014-03-12: qty 2

## 2014-03-12 MED ORDER — PROPOFOL 10 MG/ML IV BOLUS
INTRAVENOUS | Status: AC
  Filled 2014-03-12: qty 20

## 2014-03-12 MED ORDER — FENTANYL CITRATE 0.05 MG/ML IJ SOLN
INTRAMUSCULAR | Status: DC | PRN
  Administered 2014-03-12: 50 ug via INTRAVENOUS

## 2014-03-12 MED ORDER — FENTANYL CITRATE 0.05 MG/ML IJ SOLN
INTRAMUSCULAR | Status: AC
  Filled 2014-03-12: qty 5

## 2014-03-12 MED ORDER — BUPIVACAINE LIPOSOME 1.3 % IJ SUSP
INTRAMUSCULAR | Status: DC | PRN
  Administered 2014-03-12: 20 mL

## 2014-03-12 MED ORDER — PROMETHAZINE HCL 25 MG/ML IJ SOLN
INTRAMUSCULAR | Status: AC
  Filled 2014-03-12: qty 1

## 2014-03-12 MED ORDER — ROCURONIUM BROMIDE 100 MG/10ML IV SOLN
INTRAVENOUS | Status: DC | PRN
  Administered 2014-03-12: 5 mg via INTRAVENOUS

## 2014-03-12 SURGICAL SUPPLY — 27 items
BLADE HEX COATED 2.75 (ELECTRODE) ×1 IMPLANT
BRIEF STRETCH FOR OB PAD LRG (UNDERPADS AND DIAPERS) ×2 IMPLANT
CANISTER SUCTION 2500CC (MISCELLANEOUS) ×2 IMPLANT
DECANTER SPIKE VIAL GLASS SM (MISCELLANEOUS) ×2 IMPLANT
DRAPE LAPAROTOMY T 102X78X121 (DRAPES) ×2 IMPLANT
DRSG PAD ABDOMINAL 8X10 ST (GAUZE/BANDAGES/DRESSINGS) IMPLANT
ELECT REM PT RETURN 9FT ADLT (ELECTROSURGICAL) ×2
ELECTRODE REM PT RTRN 9FT ADLT (ELECTROSURGICAL) ×1 IMPLANT
GAUZE SPONGE 4X4 12PLY STRL (GAUZE/BANDAGES/DRESSINGS) ×1 IMPLANT
GAUZE SPONGE 4X4 16PLY XRAY LF (GAUZE/BANDAGES/DRESSINGS) ×2 IMPLANT
GAUZE XEROFORM 4X4 STRL (GAUZE/BANDAGES/DRESSINGS) ×1 IMPLANT
GLOVE BIOGEL PI IND STRL 8 (GLOVE) ×1 IMPLANT
GLOVE BIOGEL PI INDICATOR 8 (GLOVE) ×1
GLOVE ECLIPSE 8.0 STRL XLNG CF (GLOVE) ×2 IMPLANT
GOWN STRL REUS W/TWL XL LVL3 (GOWN DISPOSABLE) ×4 IMPLANT
KIT BASIN OR (CUSTOM PROCEDURE TRAY) ×2 IMPLANT
KIT SLIDE ONE PROLAPS HEMORR (KITS) ×2 IMPLANT
LUBRICANT JELLY K Y 4OZ (MISCELLANEOUS) ×2 IMPLANT
NEEDLE HYPO 22GX1.5 SAFETY (NEEDLE) ×2 IMPLANT
PACK BASIC VI WITH GOWN DISP (CUSTOM PROCEDURE TRAY) ×2 IMPLANT
PENCIL BUTTON HOLSTER BLD 10FT (ELECTRODE) ×1 IMPLANT
SUT VIC AB 2-0 UR6 27 (SUTURE) ×2 IMPLANT
SYRINGE 20CC LL (MISCELLANEOUS) ×2 IMPLANT
TAPE CLOTH SURG 4X10 WHT LF (GAUZE/BANDAGES/DRESSINGS) ×1 IMPLANT
TOWEL OR 17X26 10 PK STRL BLUE (TOWEL DISPOSABLE) ×2 IMPLANT
TOWEL OR NON WOVEN STRL DISP B (DISPOSABLE) ×2 IMPLANT
YANKAUER SUCT BULB TIP NO VENT (SUCTIONS) ×1 IMPLANT

## 2014-03-12 NOTE — Transfer of Care (Signed)
Immediate Anesthesia Transfer of Care Note  Patient: Jamie Sweeney  Procedure(s) Performed: Procedure(s): THD HEMORRIHOIDAL LIGATION/PEXY EXAM UNDER ANESTHESIA, HEMORRHOIDECTOMY (N/A)  Patient Location: PACU  Anesthesia Type:General  Level of Consciousness: awake, alert , oriented and patient cooperative  Airway & Oxygen Therapy: Patient Spontanous Breathing and Patient connected to face mask oxygen  Post-op Assessment: Report given to PACU RN, Post -op Vital signs reviewed and stable and Patient moving all extremities  Post vital signs: Reviewed and stable  Complications: No apparent anesthesia complications

## 2014-03-12 NOTE — Op Note (Signed)
03/12/2014  12:08 PM  PATIENT:  Jamie Sweeney  48 y.o. female  Patient Care Team: Reina FuseSheri L Booth, MD as PCP - General (Physician Assistant) Vertell NovakJames L Edwards Jr., MD as Consulting Physician (Gastroenterology)  PRE-OPERATIVE DIAGNOSIS:  internal hemorrhoids with prolapse/pain/bleeding  POST-OPERATIVE DIAGNOSIS:  internal hemorrhoids with prolapse/pain/bleeding  PROCEDURE:  Procedure(s):  THD HEMORRIHOIDAL LIGATION/PEXY  EXAM UNDER ANESTHESIA RIGHT ANTERIOR EXTERNAL HEMORRHOIDECTOMY  SURGEON:  Surgeon(s): Ardeth SportsmanSteven C. Thai Burgueno, MD  ASSISTANT: RN   ANESTHESIA:   local and general  EBL:     Delay start of Pharmacological VTE agent (>24hrs) due to surgical blood loss or risk of bleeding:  no  DRAINS: none   SPECIMEN:  Source of Specimen:  Right anterior external hemorrhoid  DISPOSITION OF SPECIMEN:  PATHOLOGY  COUNTS:  YES  PLAN OF CARE: Discharge to home after PACU  PATIENT DISPOSITION:  PACU - hemodynamically stable.  INDICATION: Pleasant woman with persistent internal and external hemorrhoids.  Persistent disease despite 2 bandings.  Diarrhea and constipation improved with fiber bowel regimen.   I recommended examination under anesthesia with more aggressive treatment with PhD ligation and possible removal:  The anatomy & physiology of the anorectal region was discussed.  The pathophysiology of hemorrhoids and differential diagnosis was discussed.  Natural history risks without surgery was discussed.   I stressed the importance of a bowel regimen to have daily soft bowel movements to minimize progression of disease.  Interventions such as sclerotherapy & banding were discussed.  The patient's symptoms are not adequately controlled by medicines and other non-operative treatments.  I feel the risks & problems of no surgery outweigh the operative risks; therefore, I recommended surgery to treat the hemorrhoids by ligation, pexy, and possible resection.  Risks such as bleeding,  infection, urinary difficulties, need for further treatment, heart attack, death, and other risks were discussed.   I noted a good likelihood this will help address the problem.  Goals of post-operative recovery were discussed as well.  Possibility that this will not correct all symptoms was explained.  Post-operative pain, bleeding, constipation, and other problems after surgery were discussed.  We will work to minimize complications.   Educational handouts further explaining the pathology, treatment options, and bowel regimen were given as well.  Questions were answered.  The patient expresses understanding & wishes to proceed with surgery.  OR FINDINGS: Patient had moderate large internal hemorrhoidal piles &f some persistent right anterior external hemorrhoid component.  DESCRIPTION:   Informed consent was confirmed. Patient underwent general anesthesia without difficulty. Patient was placed into prone positioning.  The perianal region was prepped and draped in sterile fashion. Surgical time-out confirmed our plan.  I did digital rectal examination and then transitioned over to anoscopy to get a sense of the anatomy.  I switched over to the [facility] fiber[facility]ically lit Doppler anocope.   Using the Doppler on the tip of the THD anoscope, I identified the arterial hemorrhoidal vessels coming in in the classic hexagonal anatomical pattern (right posterior/lateral/anterior, left posterior /lateral/anterior).    I proceeded to ligate the hemorrhoidal arteries. I used a 2-0 Vicryl suture on a UR-6 needle in a figure-of-eight fashion over the signal around 6 cm proximal to the anal verge. I then ran that stitch longitudinally more distally to the white line of [place]. I then tied that stitch down to cause a hemorrhoidopexy. I did that for all 6 locations.    I redid Doppler anoscopy. I Identified a signal at the left lateral & right anterior &  lateral locations.  I isolated and ligated this with a figure-of-eight  stitch. Signals went away.  At completion of this, all hemorrhoids were reduced into the rectum.  There is no more prolapse.   Patient had a persistent right anterior external hemorrhoid component.  I excised that and closed the wound with 3-0 chromic interrupted suture.  I repeated anoscopy and examination.  Hemostasis was good.  Patient is being extubated go to recovery room.    I had discussed postop care in detail with the patient in the preop holding area. There is no one in the hospital to  discuss postoperative findings with at this time.  Instructions are written.

## 2014-03-12 NOTE — Anesthesia Postprocedure Evaluation (Signed)
Anesthesia Post Note  Patient: Jamie Sweeney  Procedure(s) Performed: Procedure(s) (LRB): THD HEMORRIHOIDAL LIGATION/PEXY EXAM UNDER ANESTHESIA, HEMORRHOIDECTOMY (N/A)  Anesthesia type: General  Patient location: PACU  Post pain: Pain level controlled  Post assessment: Post-op Vital signs reviewed  Last Vitals: BP 98/44  Pulse 52  Temp(Src) 37 C (Oral)  Resp 12  SpO2 100%  LMP 02/27/2014  Post vital signs: Reviewed  Level of consciousness: sedated  Complications: No apparent anesthesia complications

## 2014-03-12 NOTE — H&P (Signed)
[facility]  [address]  [place], [place] [address] Phone: [phone] FAX: [phone]     Jamie Sweeney  1966-05-22 [id number]  CARE TEAM:  PCP: Reina Fuse, MD  Outpatient Care Team: Patient Care Team: Reina Fuse, MD as PCP - General (Physician Assistant) Vertell Novak., MD as Consulting Physician (Gastroenterology)  Inpatient Treatment Team: Treatment Team: Attending Provider: Ardeth Sportsman, MD  Reason for visit: Rectal bleeding. Worsening hemorrhoids.   Pleasant woman. Noticed with her fourth pregnancy some rectal bleeding. Sigmoidoscopy done. Found to have internal hemorrhoids. Has had a few more children. Mild intermittent rectal bleeding. Had episode of chest pain and choking episode. Cardiac workup negative. EGD done. Had colonoscopy. No other abnormalities except for moderate internal hemorrhoids.   Sent to me for evaluation 2013. I banded her back in September 2013. The rectal discomfort gradually went away. Then she had an episode of rectal bleeding that was rather severe. Concerned her. I banded the hemorrhoids again last year. That seemed to help for a while then she began to have some hemorrhoid discomfort. It was tolerable until this year. Tylenol and ibuprofen have not been enough. Some spasming. She has been using hemorrhoid ointments without much help. She has been compliant on MiraLAX. Does help her bowel movements to be every day. Occasionally they are loose. She does take omeprazole that is controlling her reflux better but has not controlled her diarrhea.   She does get burning and itching. She often feels pressure when she has bowel movements. More bleeding now. Still has a lot of stress in her family life but that is more manageable now. She was concerned. She returns for treatment.    Past Medical History  Diagnosis Date  . Migraine headache   . Reflux   . GERD (gastroesophageal reflux  disease)   . Internal hemorrhoids with bleeding 04/10/2012  . Asthma     mild  . Bronchitis, chronic   . Seizures as child    EEG done-MD said it was due to stress, none since age 89  . Varicose veins     with superficial clotting  . Depression     not severe  . Anxiety     not severe  . Complication of anesthesia     problems waking up after shoulder surgery  . PONV (postoperative nausea and vomiting)     Past Surgical History  Procedure Laterality Date  . Vulvoplasty  1998  . Hemangioma excision  1998    right hand  . Shoulder surgery  2009    right with clavical resection  . Knee surgery Right 2008    scope  . Foot surgery  2012    right plantar  . Nasal septum surgery  2006  . Hemorrhoid banding  Sep 2013  . Hemorrhoid banding  Mar 2014  . Closed manipulation shoulder Right 2009  . Tympanostomy tube placement Left 1998    infected ear-removed in 2000    History   Social History  . Marital Status: Legally Separated    Spouse Name: N/A    Number of Children: N/A  . Years of Education: N/A   Occupational History  . Not on file.   Social History Main Topics  . Smoking status: Former Smoker -- 1.50 packs/day for 11 years    Types: Cigarettes    Quit date: 03/13/1995  . Smokeless tobacco: Never Used  . Alcohol Use: No  .  Drug Use: No  . Sexual Activity: Not on file   Other Topics Concern  . Not on file   Social History Narrative  . No narrative on file    Family History  Problem Relation Age of Onset  . Prostate cancer Father   . Cancer Father     prostate/ skin  . Liver cancer Paternal Grandfather   . Cancer Paternal Grandfather     lung  . Cancer Paternal Aunt     breast  . Cancer Paternal Grandmother     breast  . Cancer Maternal Uncle     throat/skin    Current Facility-Administered Medications  Medication Dose Route Frequency Provider Last Rate Last Dose  . bupivacaine liposome (EXPAREL) 1.3 % injection 266 mg  20 mL Infiltration On  Call to OR Ardeth SportsmanSteven C. Ariani Seier, MD      . ceFAZolin (ANCEF) IVPB 2 g/50 mL premix  2 g Intravenous On Call to OR Ardeth SportsmanSteven C. Nidal Rivet, MD      . metroNIDAZOLE (FLAGYL) IVPB 500 mg  500 mg Intravenous On Call to OR Ardeth SportsmanSteven C. Euriah Matlack, MD         Allergies  Allergen Reactions  . Diflucan [Fluconazole] Hives and Rash  . Sulfa Antibiotics Rash    ROS: Constitutional:  No fevers, chills, sweats.  Weight stable Eyes:  No vision changes, No discharge HENT:  No sore throats, nasal drainage Lymph: No neck swelling, No bruising easily Pulmonary:  No cough, productive sputum CV: No orthopnea,  No exertional chest/neck/shoulder/arm pain. GI: No personal nor family history of inflammatory bowel disease, irritable bowel syndrome, allergy such as Celiac Sprue, dietary/dairy problems, colitis, ulcers nor gastritis.  No recent sick contacts/gastroenteritis.  No travel outside the country.  No changes in diet. Renal: No UTIs, No hematuria Genital:  No drainage, bleeding, masses Musculoskeletal: No severe joint pain.  Good ROM major joints Skin:  No sores or lesions.  No rashes Heme/Lymph:  No easy bleeding.  No swollen lymph nodes Neuro: No focal weakness/numbness.  No seizures Psych: No suicidal ideation.  No hallucinations  BP 109/64  Pulse 65  Temp(Src) 98.3 F (36.8 C) (Oral)  Resp 18  SpO2 100%  LMP 02/27/2014  Physical Exam: General: Pt awake/alert/oriented x4 in no major acute distress Eyes: PERRL, normal EOM. Sclera nonicteric Neuro: CN II-XII intact w/o focal sensory/motor deficits. Lymph: No head/neck/groin lymphadenopathy Psych:  No delerium/psychosis/paranoia HENT: Normocephalic, Mucus membranes moist.  No thrush Neck: Supple, No tracheal deviation Chest: No pain.  Good respiratory excursion. CV:  Pulses intact.  Regular rhythm Abdomen: Soft, Nondistended.  Nontender.  No incarcerated hernias. Ext:  SCDs BLE.  No significant edema.  No cyanosis Skin: No petechiae / purpurea.  No major  sores Musculoskeletal: No severe joint pain.  Good ROM major joints   Results:   Labs: No results found for this or any previous visit (from the past 48 hour(s)).  Imaging / Studies: Dg Chest 2 View  03/06/2014   CLINICAL DATA:  Preop  EXAM: CHEST  2 VIEW  COMPARISON:  11/27/2008  FINDINGS: Cardiomediastinal silhouette is unremarkable. No acute infiltrate or pleural effusion. No pulmonary edema. Bony thorax is unremarkable.  IMPRESSION: No active cardiopulmonary disease.   Electronically Signed   By: Natasha MeadLiviu  Pop M.D.   On: 03/06/2014 11:34    Medications / Allergies: per chart  Antibiotics: Anti-infectives   Start     Dose/Rate Route Frequency Ordered Stop   03/12/14 0934  ceFAZolin (ANCEF) IVPB 2  g/50 mL premix     2 g 100 mL/hr over 30 Minutes Intravenous On call to O.R. 03/12/14 1610 03/13/14 0559   03/12/14 0934  metroNIDAZOLE (FLAGYL) IVPB 500 mg    Comments:  Pharmacy may adjust dosing strength, interval, or rate of medication as needed for optimal therapy for the patient Send with patient on call to the OR.  Anesthesia to complete antibiotic administration <80min prior to incision per [facility].   500 mg 100 mL/hr over 60 Minutes Intravenous On call to O.R. 03/12/14 0934 03/13/14 0559      Assessment  Verlon Setting  48 y.o. female  Day of Surgery  Procedure(s): THD HEMORRIHOIDAL LIGATION/PEXY EXAM UNDER ANESTHESIA, POSSIBLE HEMORRHOIDECTOMY  Problem List:  Active Problems:   * No active hospital problems. *   Int & Ext hemorrhoids with bleeding despite banding x2   Plan:    THD hemorrhoidal suturing/pexy. Then resect any persistent external hemorrhoid tissue:   The anatomy & physiology of the anorectal region was discussed. The pathophysiology of hemorrhoids and differential diagnosis was discussed. Natural history risks without surgery was discussed. I stressed the importance of a bowel regimen to have daily soft bowel movements to minimize progression of  disease. Interventions such as sclerotherapy & banding were discussed.   The patient's symptoms are not adequately controlled by medicines and other non-operative treatments. I feel the risks & problems of no surgery outweigh the operative risks; therefore, I recommended surgery to treat the hemorrhoids by ligation, pexy, and possible resection.   Risks such as bleeding, infection, urinary difficulties, need for further treatment, heart attack, death, and other risks were discussed. I noted a good likelihood this will help address the problem. Goals of post-operative recovery were discussed as well. Possibility that this will not correct all symptoms was explained. Post-operative pain, bleeding, constipation, and other problems after surgery were discussed. We will work to minimize complications. Educational handouts further explaining the pathology, treatment options, and bowel regimen were given as well. Questions were answered. The patient expresses understanding & wishes to proceed with surgery.    Ardeth Sportsman, M.D., F.A.C.S. Gastrointestinal and Minimally Invasive Surgery [facility] [address] [place], [place] [address] [phone] Main / Paging   03/12/2014  Note: Portions of this report may have been transcribed using voice recognition software. Every effort was made to ensure accuracy; however, inadvertent computerized transcription errors may be present.   Any transcriptional errors that result from this process are unintentional.

## 2014-03-12 NOTE — Anesthesia Preprocedure Evaluation (Addendum)
Anesthesia Evaluation  Patient identified by MRN, date of birth, ID band Patient awake    Reviewed: Allergy & Precautions, H&P , NPO status , Patient's Chart, lab work & pertinent test results  History of Anesthesia Complications (+) PONV, AWARENESS UNDER ANESTHESIA and history of anesthetic complications  Airway Mallampati: II TM Distance: >3 FB Neck ROM: Full    Dental no notable dental hx.    Pulmonary asthma , former smoker,  breath sounds clear to auscultation  Pulmonary exam normal       Cardiovascular negative cardio ROS  Rhythm:Regular Rate:Normal     Neuro/Psych  Headaches, Seizures -,  PSYCHIATRIC DISORDERS Anxiety Depression    GI/Hepatic Neg liver ROS, GERD-  ,  Endo/Other  negative endocrine ROS  Renal/GU negative Renal ROS     Musculoskeletal negative musculoskeletal ROS (+)   Abdominal   Peds  Hematology negative hematology ROS (+)   Anesthesia Other Findings   Reproductive/Obstetrics negative OB ROS                          Anesthesia Physical Anesthesia Plan  ASA: II  Anesthesia Plan: General   Post-op Pain Management:    Induction: Intravenous  Airway Management Planned: LMA and Oral ETT  Additional Equipment:   Intra-op Plan:   Post-operative Plan: Extubation in OR  Informed Consent: I have reviewed the patients History and Physical, chart, labs and discussed the procedure including the risks, benefits and alternatives for the proposed anesthesia with the patient or authorized representative who has indicated his/her understanding and acceptance.   Dental advisory given  Plan Discussed with: CRNA  Anesthesia Plan Comments:         Anesthesia Quick Evaluation

## 2014-03-13 ENCOUNTER — Telehealth (INDEPENDENT_AMBULATORY_CARE_PROVIDER_SITE_OTHER): Payer: Self-pay

## 2014-03-13 ENCOUNTER — Emergency Department (HOSPITAL_COMMUNITY)
Admission: EM | Admit: 2014-03-13 | Discharge: 2014-03-13 | Disposition: A | Discharge status: Home / Self Care | Payer: Medicaid Other | Attending: Emergency Medicine | Admitting: Emergency Medicine

## 2014-03-13 ENCOUNTER — Encounter (HOSPITAL_COMMUNITY): Payer: Self-pay | Admitting: Surgery

## 2014-03-13 DIAGNOSIS — F3289 Other specified depressive episodes: Secondary | ICD-10-CM | POA: Diagnosis not present

## 2014-03-13 DIAGNOSIS — R109 Unspecified abdominal pain: Secondary | ICD-10-CM | POA: Insufficient documentation

## 2014-03-13 DIAGNOSIS — J45909 Unspecified asthma, uncomplicated: Secondary | ICD-10-CM | POA: Diagnosis not present

## 2014-03-13 DIAGNOSIS — Z8719 Personal history of other diseases of the digestive system: Secondary | ICD-10-CM | POA: Diagnosis not present

## 2014-03-13 DIAGNOSIS — F329 Major depressive disorder, single episode, unspecified: Secondary | ICD-10-CM | POA: Diagnosis not present

## 2014-03-13 DIAGNOSIS — Z791 Long term (current) use of non-steroidal anti-inflammatories (NSAID): Secondary | ICD-10-CM | POA: Diagnosis not present

## 2014-03-13 DIAGNOSIS — R110 Nausea: Secondary | ICD-10-CM | POA: Insufficient documentation

## 2014-03-13 DIAGNOSIS — Z87891 Personal history of nicotine dependence: Secondary | ICD-10-CM | POA: Insufficient documentation

## 2014-03-13 DIAGNOSIS — F411 Generalized anxiety disorder: Secondary | ICD-10-CM | POA: Diagnosis not present

## 2014-03-13 DIAGNOSIS — R339 Retention of urine, unspecified: Secondary | ICD-10-CM | POA: Insufficient documentation

## 2014-03-13 DIAGNOSIS — Z79899 Other long term (current) drug therapy: Secondary | ICD-10-CM | POA: Insufficient documentation

## 2014-03-13 DIAGNOSIS — G40909 Epilepsy, unspecified, not intractable, without status epilepticus: Secondary | ICD-10-CM | POA: Insufficient documentation

## 2014-03-13 LAB — BASIC METABOLIC PANEL
ANION GAP: 10 (ref 5–15)
BUN: 6 mg/dL (ref 6–23)
CALCIUM: 9 mg/dL (ref 8.4–10.5)
CO2: 24 meq/L (ref 19–32)
CREATININE: 0.69 mg/dL (ref 0.50–1.10)
Chloride: 106 mEq/L (ref 96–112)
GFR calc non Af Amer: >90 mL/min (ref >90)
Glucose, Bld: 93 mg/dL (ref 70–99)
Potassium: 3.9 mEq/L (ref 3.7–5.3)
SODIUM: 140 meq/L (ref 137–147)

## 2014-03-13 LAB — CBC WITH DIFFERENTIAL/PLATELET
Basophils Absolute: 0 K/uL (ref 0.0–0.1)
Basophils Relative: 0 % (ref 0–1)
Eosinophils Absolute: 0 K/uL (ref 0.0–0.7)
Eosinophils Relative: 0 % (ref 0–5)
HCT: 36.9 % (ref 36.0–46.0)
Hemoglobin: 12.9 g/dL (ref 12.0–15.0)
Lymphocytes Relative: 19 % (ref 12–46)
Lymphs Abs: 1.8 K/uL (ref 0.7–4.0)
MCH: 32.4 pg (ref 26.0–34.0)
MCHC: 35 g/dL (ref 30.0–36.0)
MCV: 92.7 fL (ref 78.0–100.0)
Monocytes Absolute: 0.7 K/uL (ref 0.1–1.0)
Monocytes Relative: 7 % (ref 3–12)
Neutro Abs: 6.8 K/uL (ref 1.7–7.7)
Neutrophils Relative %: 74 % (ref 43–77)
Platelets: 198 K/uL (ref 150–400)
RBC: 3.98 MIL/uL (ref 3.87–5.11)
RDW: 12 % (ref 11.5–15.5)
WBC: 9.3 K/uL (ref 4.0–10.5)

## 2014-03-13 LAB — URINALYSIS, ROUTINE W REFLEX MICROSCOPIC
Bilirubin Urine: NEGATIVE
Glucose, UA: NEGATIVE mg/dL
Hgb urine dipstick: NEGATIVE
Ketones, ur: NEGATIVE mg/dL
Leukocytes, UA: NEGATIVE
Nitrite: NEGATIVE
Protein, ur: NEGATIVE mg/dL
Specific Gravity, Urine: 1.008 (ref 1.005–1.030)
Urobilinogen, UA: 0.2 mg/dL (ref 0.0–1.0)
pH: 6 (ref 5.0–8.0)

## 2014-03-13 MED ORDER — OXYCODONE-ACETAMINOPHEN 5-325 MG PO TABS
1.0000 | ORAL_TABLET | Freq: Once | ORAL | Status: AC
  Administered 2014-03-13: 1 via ORAL
  Filled 2014-03-13: qty 1

## 2014-03-13 MED ORDER — ONDANSETRON HCL 4 MG PO TABS
4.0000 mg | ORAL_TABLET | Freq: Once | ORAL | Status: AC
  Administered 2014-03-13: 4 mg via ORAL
  Filled 2014-03-13: qty 1

## 2014-03-13 NOTE — ED Notes (Signed)
MD Silverio LayYao ok for patient to take prescribed Oxycodone as scheduled for procedure done yesterday.

## 2014-03-13 NOTE — Telephone Encounter (Signed)
Pt is calling from the ED requesting medication for nausea.  She presented to the ED today because she had not urinated.  She will be sent home with a Foley catheter.  I advised her to request nausea medication from the ED physician before she goes home.  Pt will do so.

## 2014-03-13 NOTE — Telephone Encounter (Signed)
Called pt to see if she has been able to urinate on her own and she has not. The pt only dribbled a tiny bit earlier. The pt has tried sitting in a warm sitz bath and tried taking warm shower to urinate. The pt has not had a BM either but is taking her daily Miralax along with 2 Tbls of Milk of Magnesia. The pt is passing gas but her belly is really bloated. I advised pt that Dr Michaell CowingGross stated that constipation can keep her from relaxing to urinate. The pt is so uncomfortable now she is crying and just feeling miserable. [facility] in to help with the urine retention. I advised pt that if they leave the catheter in for the weekend that she needs to call me so I can make an appt for Monday am for the pt to come to the office to get the catheter removed. I advised pt that I would want to take the catheter out in the am so she would have time on Monday to be able to urinate on her own. I advised pt that if her bowels don't move today that she needs to take more of the milk of magnesia when she gets back home from the ER. We want the pt having one soft BM daily. I will call [facility] ER to notify them the pt is coming to them. Pt understands.   Called [facility] charge nurse spoke to [place], [place] gave her the information about the pt coming to the ER to get cath for urinary retention s/p hemorrhoidectomy by Dr Michaell CowingGross.

## 2014-03-13 NOTE — Telephone Encounter (Signed)
Try urinating while submerged in the bathtub.  Otherwise, will need I&O cath in office or ED.  Make sure she has had a BM - constipation can keep pt from relaxing to urinate

## 2014-03-13 NOTE — Discharge Instructions (Signed)
Keep foley in.   Call urologist for follow up in several days.   Try not to take too much pain medications.   Return to ER if the foley is not draining, blood in urine, severe pain.

## 2014-03-13 NOTE — Telephone Encounter (Signed)
Pt s/p hemorrhoidectomy on 03/12/14 by Dr Michaell CowingGross. Pt states that she has not urinated since about 9pm last night. Pt states that she has taken 2 sitzs baths and also gotten into the shower to help stimulate. Pt states that this has not worked for her at this time. Pt states that she feels like she needs to empty her bladder but is unable to do so. Advised pt to make sure that she is drinking plenty of fluids as well. Informed pt that I would send Dr Michaell CowingGross a message for any further recommendations. Pt verbalized understanding and agrees with POC.

## 2014-03-13 NOTE — ED Notes (Signed)
Pt from home c/o urinary retention since she had hemorrhoid surgery yesterday. Pt reports that she last urinated around 9pm last night. Pt c/o nausea also. Pt is A&O and in NAD

## 2014-03-13 NOTE — ED Provider Notes (Signed)
CSN: [id number]     Arrival date & time 03/13/14  1349 History   First MD Initiated Contact with Patient 03/13/14 1404     Chief Complaint  Patient presents with  . Urinary Retention     (Consider location/radiation/quality/duration/timing/severity/associated sxs/prior Treatment) The history is provided by the patient.  Jamie Sweeney is a 48 y.o. female hx of GERD, asthma, seizures hemorrhoids here with urinary retention. She had hemorrhoid surgery yesterday. Was under general anesthesia. Also has been taking oxycodone at home. She was unable to urinate since 9 pm last night. Felt nauseated, no vomiting or fever. No BM since yesterday. Complains of severe lower abdominal pain.    Past Medical History  Diagnosis Date  . Migraine headache   . Reflux   . GERD (gastroesophageal reflux disease)   . Internal hemorrhoids with bleeding 04/10/2012  . Asthma     mild  . Bronchitis, chronic   . Seizures as child    EEG done-MD said it was due to stress, none since age 42  . Varicose veins     with superficial clotting  . Depression     not severe  . Anxiety     not severe  . Complication of anesthesia     problems waking up after shoulder surgery  . PONV (postoperative nausea and vomiting)    Past Surgical History  Procedure Laterality Date  . Vulvoplasty  1998  . Hemangioma excision  1998    right hand  . Shoulder surgery  2009    right with clavical resection  . Knee surgery Right 2008    scope  . Foot surgery  2012    right plantar  . Nasal septum surgery  2006  . Hemorrhoid banding  Sep 2013  . Hemorrhoid banding  Mar 2014  . Closed manipulation shoulder Right 2009  . Tympanostomy tube placement Left 1998    infected ear-removed in 2000  . Transanal hemorrhoidal dearterialization N/A 03/12/2014    Procedure: THD HEMORRIHOIDAL LIGATION/PEXY EXAM UNDER ANESTHESIA, HEMORRHOIDECTOMY;  Surgeon: Ardeth Sportsman, MD;  Location: [facility];  Service: General;  Laterality: N/A;    Family History  Problem Relation Age of Onset  . Prostate cancer Father   . Cancer Father     prostate/ skin  . Liver cancer Paternal Grandfather   . Cancer Paternal Grandfather     lung  . Cancer Paternal Aunt     breast  . Cancer Paternal Grandmother     breast  . Cancer Maternal Uncle     throat/skin   History  Substance Use Topics  . Smoking status: Former Smoker -- 1.50 packs/day for 11 years    Types: Cigarettes    Quit date: 03/13/1995  . Smokeless tobacco: Never Used  . Alcohol Use: No   OB History   Grav Para Term Preterm Abortions TAB SAB Ect Mult Living                 Review of Systems  Gastrointestinal: Positive for abdominal pain.  All other systems reviewed and are negative.     Allergies  Beef-derived products; Grape extract (vitis vinifera); Diflucan; and Sulfa antibiotics  Home Medications   Prior to Admission medications   Medication Sig Start Date End Date Taking? Authorizing Provider  acetaminophen (TYLENOL) 500 MG tablet Take 1,000 mg by mouth every 6 (six) hours as needed for mild pain or headache.   Yes Historical Provider, MD  albuterol (PROVENTIL HFA;VENTOLIN HFA) 108 (90  BASE) MCG/ACT inhaler Inhale 1 puff into the lungs every 6 (six) hours as needed for wheezing or shortness of breath.   Yes Historical Provider, MD  Azelastine HCl (ASTEPRO) 0.15 % SOLN Place 2 sprays into both nostrils daily as needed (for allergies).   Yes Historical Provider, MD  Calcium-Vitamin D-Vitamin K (CALCIUM SOFT CHEWS PO) Take 500 mg by mouth 2 (two) times daily. Gummy   Yes Historical Provider, MD  cetirizine (ZYRTEC) 10 MG tablet Take 10 mg by mouth daily.   Yes Historical Provider, MD  EPINEPHrine 0.3 mg/0.3 mL IJ SOAJ injection Inject 0.3 mg into the muscle once.   Yes Historical Provider, MD  fexofenadine (ALLEGRA) 180 MG tablet Take 180 mg by mouth daily.   Yes Historical Provider, MD  fluticasone (FLONASE) 50 MCG/ACT nasal spray Place 1 spray into both  nostrils daily as needed for allergies or rhinitis.   Yes Historical Provider, MD  ketotifen (ALAWAY) 0.025 % ophthalmic solution Place 1 drop into both eyes 2 (two) times daily as needed (for allergies).   Yes Historical Provider, MD  montelukast (SINGULAIR) 10 MG tablet Take 10 mg by mouth at bedtime.   Yes Historical Provider, MD  Multiple Vitamin (MULTIVITAMIN) tablet Take 1 tablet by mouth daily.   Yes Historical Provider, MD  naproxen (NAPROSYN) 500 MG tablet Take 1 tablet (500 mg total) by mouth 2 (two) times daily with a meal. 03/12/14  Yes Ardeth SportsmanSteven C. Gross, MD  Olopatadine HCl (PATADAY) 0.2 % SOLN Place 1 drop into both eyes at bedtime as needed (for allergies).   Yes Historical Provider, MD  oxyCODONE (OXY IR/ROXICODONE) 5 MG immediate release tablet Take 1-2 tablets (5-10 mg total) by mouth every 4 (four) hours as needed for moderate pain, severe pain or breakthrough pain. 03/12/14  Yes Ardeth SportsmanSteven C. Gross, MD  PRESCRIPTION MEDICATION Inject as directed once a week.   Yes Historical Provider, MD  topiramate (TOPAMAX) 50 MG tablet Take 50-100 mg by mouth 2 (two) times daily.    Yes Historical Provider, MD  traZODone (DESYREL) 50 MG tablet Take 50 mg by mouth at bedtime.   Yes Historical Provider, MD   BP 108/62  Pulse 75  Temp(Src) 98.7 F (37.1 C) (Oral)  Resp 20  SpO2 100%  LMP 02/17/2014 Physical Exam  Nursing note and vitals reviewed. Constitutional: She is oriented to person, place, and time.  Uncomfortable   HENT:  Head: Normocephalic.  Mouth/Throat: Oropharynx is clear and moist.  Eyes: Conjunctivae and EOM are normal. Pupils are equal, round, and reactive to light.  Neck: Normal range of motion. Neck supple.  Cardiovascular: Normal rate, regular rhythm and normal heart sounds.   Pulmonary/Chest: Effort normal and breath sounds normal. No respiratory distress. She has no wheezes. She has no rales.  Abdominal:  Distended in lower abdomen. + tenderness in suprapubic area    Genitourinary:  Erythema around rectum, no active bleeding. Deferred rectal exam given hemorrhoid surgery yesterday   Musculoskeletal: Normal range of motion. She exhibits no edema and no tenderness.  Neurological: She is alert and oriented to person, place, and time. No cranial nerve deficit. Coordination normal.  Skin: Skin is warm and dry.  Psychiatric: She has a normal mood and affect. Her behavior is normal. Judgment and thought content normal.    ED Course  Procedures (including critical care time) Labs Review Labs Reviewed  CBC WITH DIFFERENTIAL  BASIC METABOLIC PANEL  URINALYSIS, ROUTINE W REFLEX MICROSCOPIC    Imaging Review No results  found.   EKG Interpretation None      MDM   Final diagnoses:  None    Jamie Sweeney is a 48 y.o. female here with urinary retention. Likely from anesthesia vs narcotic use. Deferred rectal given recent hemorrhoid surgery. Bladder scan showed > 1 L in bladder. Will place foley, check Cr.   3:12 PM Foley placed, drained out 1.5 L. Labs unremarkable. UA nl. Will keep foley in, have her see urology.     Richardean Canal, MD 03/13/14 [phone]

## 2014-03-13 NOTE — ED Notes (Signed)
Pt c/o 10/10 rectal pain and bladder pain. PA notified d/t patient's pending discharge. Patient's scheduled oxycodone did not work and it is not currently time for next dose.

## 2014-03-16 ENCOUNTER — Other Ambulatory Visit (INDEPENDENT_AMBULATORY_CARE_PROVIDER_SITE_OTHER): Payer: Self-pay | Admitting: Surgery

## 2014-03-16 ENCOUNTER — Telehealth (INDEPENDENT_AMBULATORY_CARE_PROVIDER_SITE_OTHER): Payer: Self-pay

## 2014-03-16 MED ORDER — PROMETHAZINE HCL 12.5 MG PO TABS: 12.5000 mg | ORAL_TABLET | Freq: Four times a day (QID) | ORAL | Status: DC | PRN

## 2014-03-16 MED ORDER — ONDANSETRON HCL 4 MG PO TABS: 4.0000 mg | ORAL_TABLET | Freq: Three times a day (TID) | ORAL | Status: DC | PRN

## 2014-03-16 NOTE — Telephone Encounter (Signed)
Called pt made her a nurse visit for in the am at 9:00 to come have foley removed. I advised pt that she needs to go home after having the foley removed and drink plenty of liquids to be able to urinate on her own by lunch time. The pt understands and will see tomorrow.

## 2014-03-16 NOTE — Telephone Encounter (Signed)
Patient states she went to [facility] last night for nausea . Xray indicated she had an ileus of the bowel. She was advise to f/u with Dr. Michaell CowingGross . She is asking if foley should be removed . Advised her we would get order from Dr. Michaell CowingGross and call her back

## 2014-03-16 NOTE — Telephone Encounter (Signed)
Rx for nausea ordered

## 2014-03-17 ENCOUNTER — Ambulatory Visit (INDEPENDENT_AMBULATORY_CARE_PROVIDER_SITE_OTHER): Payer: Medicaid Other

## 2014-03-17 ENCOUNTER — Telehealth (INDEPENDENT_AMBULATORY_CARE_PROVIDER_SITE_OTHER): Payer: Self-pay

## 2014-03-17 DIAGNOSIS — K648 Other hemorrhoids: Secondary | ICD-10-CM

## 2014-03-17 NOTE — Telephone Encounter (Signed)
Pt called with update on urine output. She has urinated twice since foley was removed but both time had to be stimulated by getting in warm shower water for a few minutes. She is unable to tell if she has emptied her bladder completely either time though. Advised we will let Dr Michaell CowingGross know and make sure he is okay with this and call her back.

## 2014-03-17 NOTE — Telephone Encounter (Signed)
Called pt back to notify her that she is doing ok with the urinating with warm water but if this is still happening tomorrow with having to use warm water to urinate then will place referral to urology. I advised pt to continue to force liquids so she can try to urinate by herself. Pt will call in the am with an update.

## 2014-03-17 NOTE — Telephone Encounter (Signed)
Pt came in late for her nurse visit to have foley removed.

## 2014-03-17 NOTE — Progress Notes (Signed)
Pt s/p hemorrhoidectomy on 03/12/14 by Dr Michaell CowingGross. Pt was having some urinary retention and a catheter was placed. Pt was in the office today to have her foley removed. Foley catheter was removed. Informed pt that she needs to make sure that she urinates and is able to empty her bladder within 6 hours. Pt verbalized understanding. Pt will call our office if she is unable to do so.

## 2014-03-17 NOTE — Telephone Encounter (Signed)
Error - duplicate

## 2014-03-17 NOTE — Telephone Encounter (Signed)
LMOM for pt to call our office b/c did not show for the nurse only this am to have foley removed. I want her to come in the am so she can give her self plenty of time to work on drinking liquids to see if she can urinate on her own. I am just worried if pt can't urinate on her own than we will have to refer pt to a urologist.

## 2014-03-18 NOTE — Telephone Encounter (Signed)
Pt called this am to give a update. Pt states that she had some difficulty last night however she felt that she emptied her bladder @ 11:00 by herself without having to sit in warm water. Pt states that her bowels have started also working again which she feels that this has helped. Pt states that she will call us back if she feels that the issue does not completely resolve. Informed pt that at that time she may need a referral for urology. Pt verbalized understanding and agrees with POC.

## 2014-03-19 ENCOUNTER — Telehealth (INDEPENDENT_AMBULATORY_CARE_PROVIDER_SITE_OTHER): Payer: Self-pay

## 2014-03-19 DIAGNOSIS — R39198 Other difficulties with micturition: Secondary | ICD-10-CM

## 2014-03-19 NOTE — Addendum Note (Signed)
Addended by: Ethlyn GallerySPILLERS, Baxter Gonzalez on: 03/19/2014 03:24 PM   Modules accepted: Orders

## 2014-03-19 NOTE — Telephone Encounter (Signed)
Pt s/p hemorrhoidectomy on 03/12/14 by Dr Michaell CowingGross. Pt has continued to have complications with urinary retention. Pt has not been able to use the bathroom unless she does a sitz bath. Pt also states that she has doubled up on her Miralax and she has not had a good BM. Pt states that she feels the urge to go and she just dribbles and has burning also when she goes. Informed pt that I would let Dr Michaell CowingGross and his assistant aware. Informed pt that she may need to go to see a urologist. Informed her that we would contact her with any different recommendations from Dr Michaell CowingGross. Please advise.

## 2014-03-19 NOTE — Telephone Encounter (Signed)
As has been said many times the past few days, have the patient increased a MiraLAX bowel function.  As was discussed yesterday, get a urology appointment.  Just do it.  She cannot back out again this time.

## 2014-03-19 NOTE — Telephone Encounter (Signed)
Called pt to notify her that Dr Michaell CowingGross recommends still the pt increasing her Miralax to help regulate one soft BM daily. Dr Michaell CowingGross also recommends pt seeing urology since having issues still with difficulty urinating on her own. Pt said that she has increased her Miralax but has not taken any today b/c she was waiting for a call back from us. I advised pt no matter what she is supposed to be taking the Miralax daily and increasing the dosage till she gets regulated. Pt said she had loose stools yesterday but only little bit today. Pt states that her bottom has been swollen and really tender since she has been straining to urinate along with trying to have a BM. The pt has had some clear mucous from the rectum. I advised pt that she will have some mucous sometimes and swelling is normal after hemorrhoidectomy. I advised pt that she should being drinking plenty of liquids and taking some kind of fiber supplement if not taking Miralax. Pt asked about doing Milk of Magnesia today since she has not had a BM. I advised pt that she could take MOM if thought she needed it along with Miralax. I advised pt that I did call [facility] about getting her seen ASAP. I spoke to [place]ime in triage who is going to call pt b/c they have no appt's for tomorrow maybe next week. Marijean NiemannJaime will call pt to see if pt needs to go back to ER to get another foley placed until they can see her in the office next week. Pt understands.

## 2014-03-20 NOTE — Telephone Encounter (Signed)
[facility] will see pt today at 12:45 by Dr Annabell HowellsWrenn. Will fax notes to [phone].

## 2014-03-23 ENCOUNTER — Telehealth (INDEPENDENT_AMBULATORY_CARE_PROVIDER_SITE_OTHER): Payer: Self-pay | Admitting: *Deleted

## 2014-03-23 DIAGNOSIS — Z9889 Other specified postprocedural states: Secondary | ICD-10-CM

## 2014-03-23 DIAGNOSIS — Z8719 Personal history of other diseases of the digestive system: Secondary | ICD-10-CM

## 2014-03-23 MED ORDER — OXYCODONE HCL 5 MG PO TABS: 5.0000 mg | ORAL_TABLET | ORAL | Status: DC | PRN

## 2014-03-23 NOTE — Telephone Encounter (Signed)
OK to renew narcotics postoperatively for the first 90 days, especially in patients with hemorrhoid surgery & her struggles.  Oxy 5-10mg  po q6h prn pain #40

## 2014-03-23 NOTE — Telephone Encounter (Signed)
Pt called requesting a refill of Oxycodone.  She did advise that she was seen at [facility] and they removed 2500 ml of urine from where she was having retention.  She states that they advised her that it could take up to 6 weeks before her bladder would get back to normal.  Pt rates her pain at a 5-6 on a scale from 1-10.  I know it is protocol to give a refill, but just wanted to make sure with what all this pt has going on.  She did advise that the naproxen was not helping much. I advised pt I would get with Dr. Michaell Cowing and someone would call her back.  Pt verbalized understanding.   Please advise!  Victorino Dike

## 2014-03-23 NOTE — Telephone Encounter (Signed)
LMOM for pt notifying her written rx for Oxycodone  #40 is ready for p/u at the front desk.

## 2014-03-23 NOTE — Addendum Note (Signed)
Addended by: Ethlyn Gallery on: 03/23/2014 10:34 AM   Modules accepted: Orders

## 2014-03-30 ENCOUNTER — Encounter (INDEPENDENT_AMBULATORY_CARE_PROVIDER_SITE_OTHER): Payer: Medicaid Other | Admitting: Surgery

## 2014-04-01 ENCOUNTER — Encounter (INDEPENDENT_AMBULATORY_CARE_PROVIDER_SITE_OTHER): Payer: Medicaid Other | Admitting: Surgery

## 2014-05-20 ENCOUNTER — Other Ambulatory Visit: Payer: Self-pay | Admitting: Obstetrics and Gynecology

## 2014-05-20 DIAGNOSIS — R928 Other abnormal and inconclusive findings on diagnostic imaging of breast: Secondary | ICD-10-CM

## 2014-06-02 ENCOUNTER — Ambulatory Visit
Admission: RE | Admit: 2014-06-02 | Discharge: 2014-06-02 | Disposition: A | Discharge status: Home / Self Care | Payer: Medicaid Other | Source: Ambulatory Visit | Attending: Obstetrics and Gynecology | Admitting: Obstetrics and Gynecology

## 2014-06-02 DIAGNOSIS — R928 Other abnormal and inconclusive findings on diagnostic imaging of breast: Secondary | ICD-10-CM

## 2014-06-15 ENCOUNTER — Ambulatory Visit: Payer: Medicaid Other | Attending: Adult Health | Admitting: Physical Therapy

## 2014-08-27 IMAGING — CR DG CHEST 2V
2 series · 2 of 2 positions shown · non-contrast
Comparison: 11/27/2008

CLINICAL DATA: Preop

EXAM:
CHEST  2 VIEW

[w chest pa]
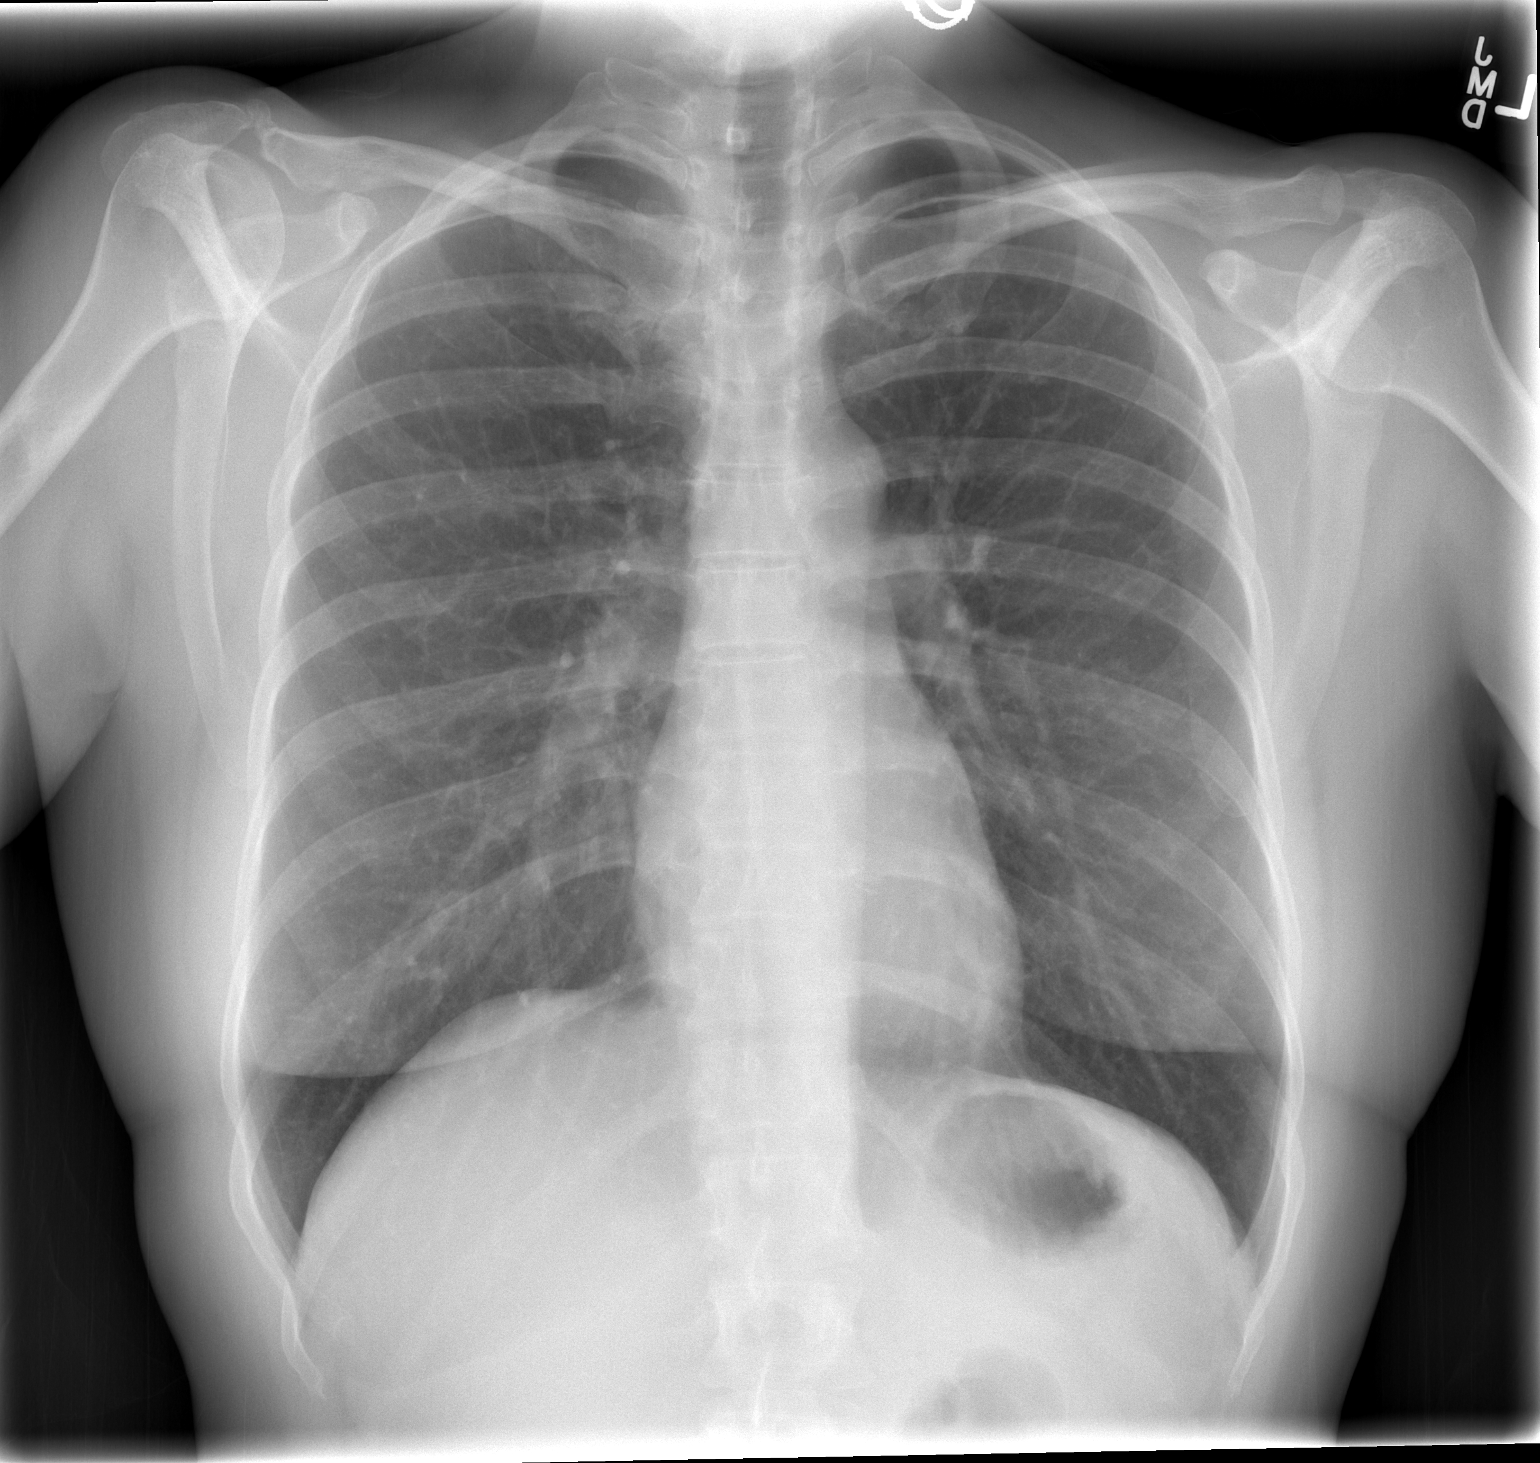

[w chest lat]
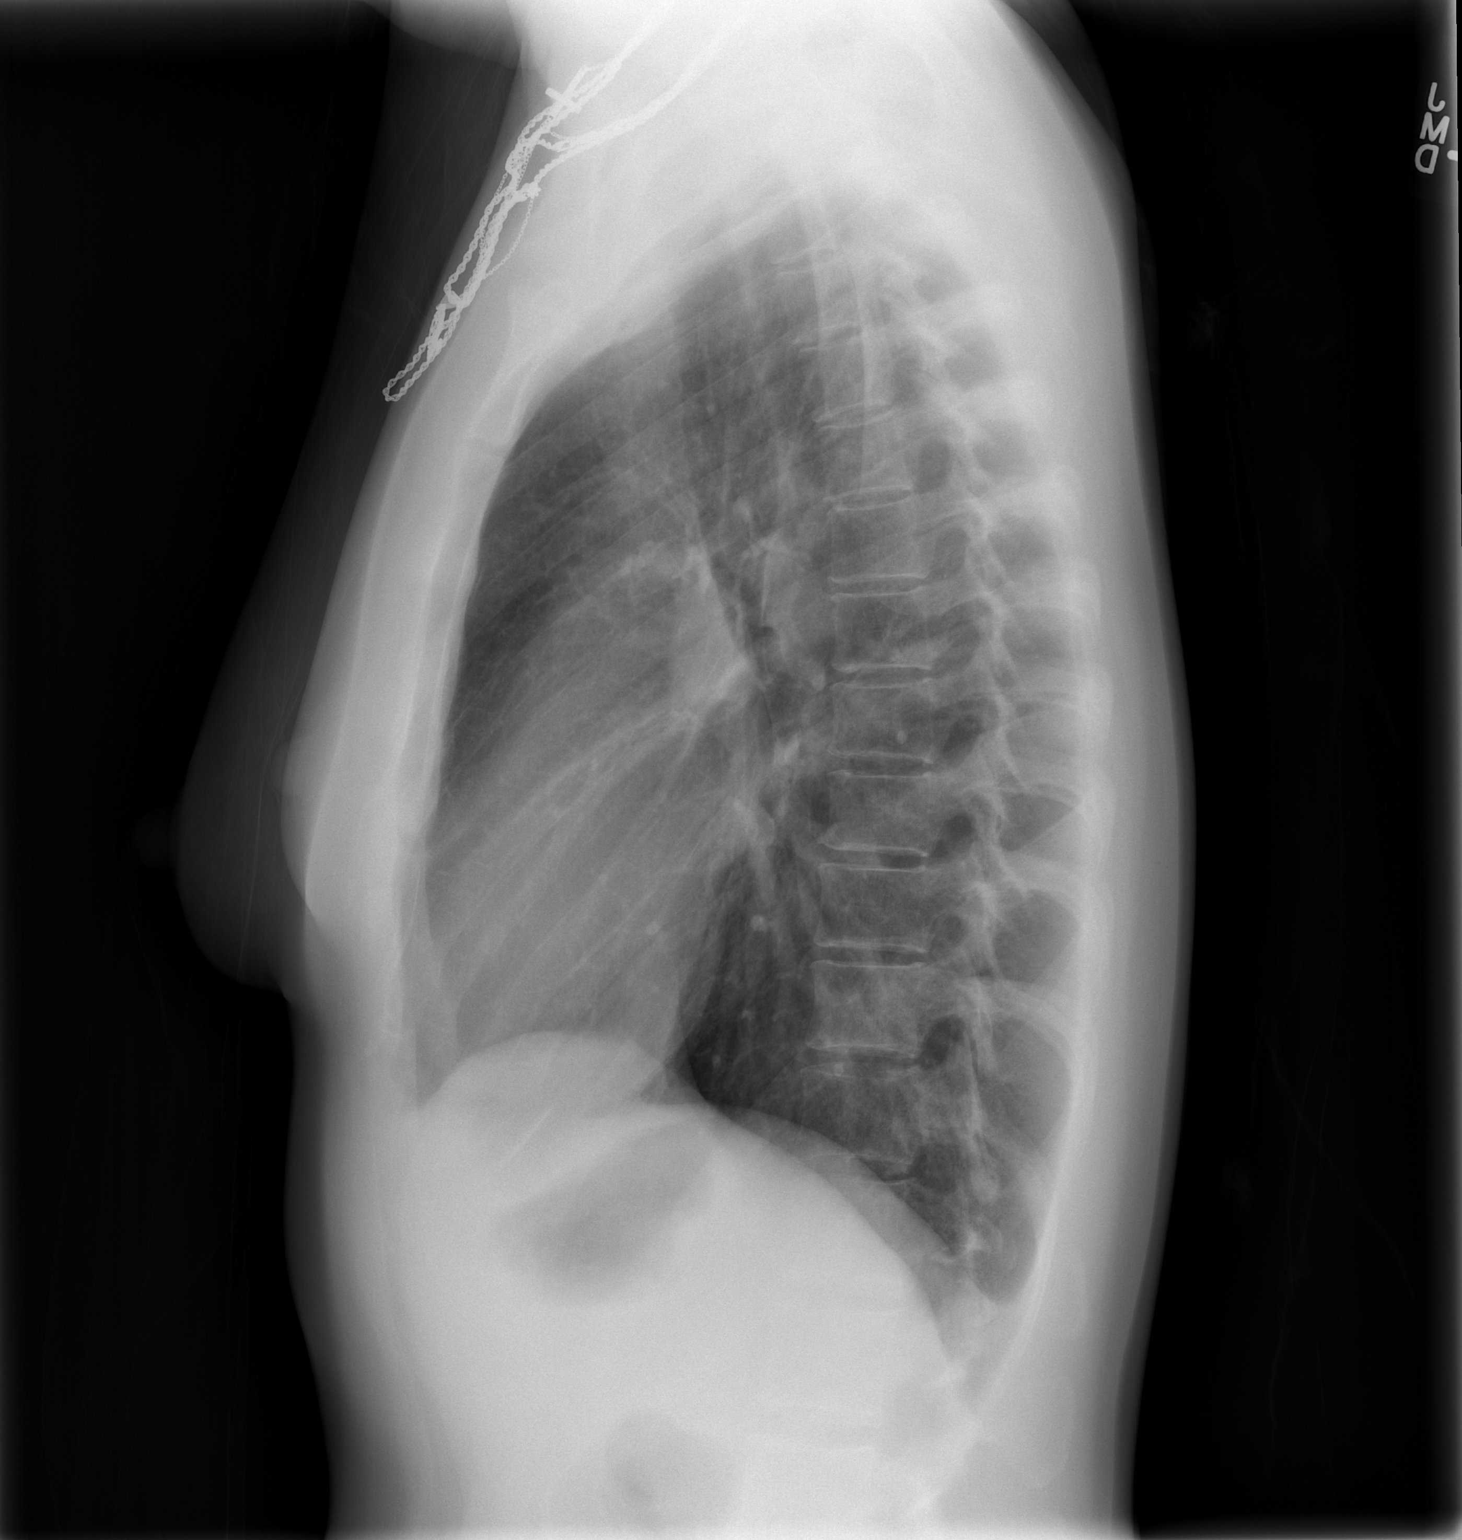

[2 of 2 positions shown; findings below may reference images not displayed]

FINDINGS: Cardiomediastinal silhouette is unremarkable. No acute infiltrate or
pleural effusion. No pulmonary edema. Bony thorax is unremarkable.
IMPRESSION: No active cardiopulmonary disease.

## 2015-05-06 ENCOUNTER — Ambulatory Visit (INDEPENDENT_AMBULATORY_CARE_PROVIDER_SITE_OTHER): Payer: Medicaid Other | Admitting: *Deleted

## 2015-05-06 DIAGNOSIS — J309 Allergic rhinitis, unspecified: Secondary | ICD-10-CM | POA: Diagnosis not present

## 2015-05-07 ENCOUNTER — Telehealth: Payer: Self-pay | Admitting: Pediatrics

## 2015-05-07 NOTE — Telephone Encounter (Signed)
Talked to patient - advised cold compress and benadryl as needed for next 2 days.  For next injection, decrease dose to 0.05 ml of 1:1000 for both vials

## 2015-05-07 NOTE — Telephone Encounter (Signed)
Spoke to pt she stated that a large softball size knot has developed at the injection site, feverish hot, very red. Tryed Benadryl with no relief.  She ha shad this happened for the past several injections over the course of the last few months.

## 2015-05-07 NOTE — Telephone Encounter (Signed)
Patient was in yesterday for her injection and is having a reaction to it. Please call back at # listed.

## 2015-05-20 ENCOUNTER — Ambulatory Visit (INDEPENDENT_AMBULATORY_CARE_PROVIDER_SITE_OTHER): Payer: Medicaid Other | Admitting: *Deleted

## 2015-05-20 DIAGNOSIS — J309 Allergic rhinitis, unspecified: Secondary | ICD-10-CM | POA: Diagnosis not present

## 2015-06-03 ENCOUNTER — Ambulatory Visit (INDEPENDENT_AMBULATORY_CARE_PROVIDER_SITE_OTHER): Payer: Medicaid Other | Admitting: *Deleted

## 2015-06-03 DIAGNOSIS — J309 Allergic rhinitis, unspecified: Secondary | ICD-10-CM | POA: Diagnosis not present

## 2015-07-01 ENCOUNTER — Ambulatory Visit (INDEPENDENT_AMBULATORY_CARE_PROVIDER_SITE_OTHER): Payer: Medicaid Other | Admitting: *Deleted

## 2015-07-01 DIAGNOSIS — J309 Allergic rhinitis, unspecified: Secondary | ICD-10-CM | POA: Diagnosis not present

## 2015-07-08 ENCOUNTER — Ambulatory Visit (INDEPENDENT_AMBULATORY_CARE_PROVIDER_SITE_OTHER): Payer: Medicaid Other | Admitting: *Deleted

## 2015-07-08 DIAGNOSIS — J309 Allergic rhinitis, unspecified: Secondary | ICD-10-CM

## 2015-08-12 ENCOUNTER — Ambulatory Visit (INDEPENDENT_AMBULATORY_CARE_PROVIDER_SITE_OTHER): Payer: Medicaid Other

## 2015-08-12 DIAGNOSIS — J309 Allergic rhinitis, unspecified: Secondary | ICD-10-CM

## 2015-08-25 ENCOUNTER — Ambulatory Visit (INDEPENDENT_AMBULATORY_CARE_PROVIDER_SITE_OTHER): Payer: Medicaid Other

## 2015-08-25 DIAGNOSIS — J309 Allergic rhinitis, unspecified: Secondary | ICD-10-CM

## 2016-09-11 NOTE — Addendum Note (Signed)
Addended by: Berna BueWHITAKER, Mellina Benison L on: 09/11/2016 11:11 AM   Modules accepted: Orders

## 2019-10-23 NOTE — Progress Notes (Signed)
New Patient Note  RE: Jamie Sweeney MRN: [id number] DOB: Feb 21, 1966 Date of Office Visit: 10/24/2019  Referring provider: Ladora Daniel, PA-C Primary care provider: Stoney Bang, PA-C  Chief Complaint: Allergic Rhinitis  and Asthma  History of Present Illness: I had the pleasure of seeing Jamie Sweeney for initial evaluation at the [facility] on 10/24/2019. She is a 54 y.o. female, who is self-referred here for the evaluation of allergies and asthma. Patient used to be a patient at this office in the past - over 10 years ago.   Allergic rhinitis: She reports symptoms of itchy eyes/throat/ears, teeth pain, sneezing, nasal congestion, rhinorrhea. Symptoms have been going on for 20+ years. The symptoms are present all year around with worsening in spring and summer. Anosmia: diminished. Headache: yes. She has used Careers adviser, zyrtec, Singulair, Astelin, Flonase, pataday, Pazeo, cromolyn, Alaway with some improvement in symptoms. Sinus infections: 3-4 per year. Previous work up includes: last skin testing over 3 years ago was positive to dust mites, feathers, mold, cat, dog, trees, grass. Patient was on allergy injections on and off for about 10 years. Patient had large localized reactions. One episode of systemic reaction with asthma flare.  Last injection was in September 2020 at an outside facility. The injections have been helping and patient would like to restart in our office. Previous ENT evaluation: yes had sinus surgery many years ago. Previous sinus imaging: not recently. History of nasal polyps: no. Last eye exam: last year. History of reflux: yes and takes Pepcid.  Asthma: She reports symptoms of chest tightness, shortness of breath, coughing for 30 years. Current medications include albuterol prn which help. She reports not using aerochamber with inhalers. She tried the following inhalers: none. Main triggers are exercise, allergies, cold weather. In the last month,  frequency of symptoms: <1x/week and 3-4 times a week during allergy seasons. Frequency of nocturnal symptoms: 0x/month. Frequency of SABA use: <1x/week and 3-4 times a week during allergy seasons. Interference with physical activity: yes. Sleep is undisturbed. In the last 12 months, emergency room visits/urgent care visits/doctor office visits or hospitalizations due to respiratory issues: 0. In the last 12 months, oral steroids courses: injection in January. Lifetime history of hospitalization for respiratory issues: 0. Prior intubations: 0. Asthma was diagnosed at age 54. History of pneumonia: yes. She was evaluated by allergist in the past. Smoking exposure: quit in 1996. Up to date with flu vaccine: yes.   Assessment and Plan: [name] is a 54 y.o. female with: Other allergic rhinitis Perennial rhinoconjunctivitis symptoms for the past 20+ years with worsening in the spring and summer.  Patient has been on allergy immunotherapy on and off for about 10 years.  History of large localized reaction with one systemic reaction.  Last injection was in September 2020 at an outside facility.  Patient would like to continue allergy injections at our office.  She has tried various antihistamines, Singulair, Astelin, Flonase, Pataday, Pazeo, cromolyn, Alaway with some benefit.  Sinus surgery in the past as well.  Takes Pepcid for reflux.  Today's skin testing showed: Positive to grass, weed, ragweed, trees, dust mites, cat, dog, mold.   Start environmental control measures.  May use over the counter antihistamines such as Zyrtec (cetirizine), Claritin (loratadine), Allegra (fexofenadine), or Xyzal (levocetirizine) daily as needed. May take twice a day during allergy flare.   Continue Singulair 10mg  daily at night.   Start dymista (fluticasone + azelastine nasal spray combination) 1 spray per nostril twice a  day.  This replaces all your other nasal sprays.   If it's not covered let us know.   May use  olopatadine eye drops 0.2% once a day as needed for itchy/watery eyes.  Had a detailed discussion with patient/family that clinical history is suggestive of allergic rhinitis, and may benefit from allergy immunotherapy (AIT). Discussed in detail regarding the dosing, schedule, side effects (mild to moderate local allergic reaction and rarely systemic allergic reactions including anaphylaxis), and benefits (significant improvement in nasal symptoms, seasonal flares of asthma) of immunotherapy with the patient. There is significant time commitment involved with allergy shots, which includes weekly immunotherapy injections for first 9-12 months and then biweekly to monthly injections for 3-5 years.   Start allergy injections.  Consent form was signed.  Allergic conjunctivitis of both eyes  See assessment and plan as above allergic rhinitis.  Moderate persistent asthma without complication Diagnosed with asthma over 30 years ago and currently using albuterol as needed.  Usually flares during the spring and summer and using albuterol 3-4 times a week lately.  No prior ICS inhaler.  Patient is an ex-smoker.  Today's spirometry was normal with 7% improvement in FEV1 post bronchodilator treatment.  Patient clinically feeling better. . Daily controller medication(s): start Arnuity 100 1 puff daily and rinse mouth afterwards. Most likely will need to use during the spring and summer months when asthma flares.  . May use albuterol rescue inhaler 2 puffs as needed for shortness of breath, chest tightness, coughing, and wheezing. May use albuterol rescue inhaler 2 puffs 5 to 15 minutes prior to strenuous physical activities. Monitor frequency of use.  . Get spirometry at next visit.  Pollen-food allergy Patient is concerned about food allergies as she has perioral itching to various foods including gray food, peanuts, tree nuts, avocado, certain foods.  Patient had IgG blood work which showed multiple  positives per patient report by a naturopath. 2019 IgE blood work was positive to wheat, peanut, soy, hazelnut, almonds, cashews, scallop, sesame and walnut.  Today's skin testing showed: Borderline to peanut, soy, almond, hazelnut, pistachio, cottonseed, sweet potato, peas, navy bean, orange.   Continue to avoid foods that bother you.   Keep a food journal.   Discussed that her food triggered oral and throat symptoms are likely caused by oral food allergy syndrome (OFAS). This is caused by cross reactivity of pollen with fresh fruits and vegetables, and nuts. Symptoms are usually localized in the form of itching and burning in mouth and throat. Very rarely it can progress to more severe symptoms. Eating foods in cooked or processed forms usually minimizes symptoms. I recommended avoidance of eating the problem foods, especially during the peak season(s). Sometimes, OFAS can induce severe throat swelling or even a systemic reaction; with such instance, I advised them to report to a local ER. A list of common pollens and food cross-reactivities was provided to the patient.   I have prescribed epinephrine injectable and demonstrated proper use. For mild symptoms you can take over the counter antihistamines such as Benadryl and monitor symptoms closely. If symptoms worsen or if you have severe symptoms including breathing issues, throat closure, significant swelling, whole body hives, severe diarrhea and vomiting, lightheadedness then inject epinephrine and seek immediate medical care afterwards.  Food action plan given  History of frequent upper respiratory infection History of frequent upper respiratory infections including pneumonias.  Usually takes 3-4 courses of antibiotics per year.  No previous immune evaluation.  Keep track of infections.  We will get basic immune evaluation.  Other atopic dermatitis History of eczema in the past.  Well-controlled and stable currently.  Continue proper  skin care measures.  History of urticaria History of hives in the past with no known triggers.  Monitor symptoms and if they start to recur then we will do some additional blood work at that time.  Hx of allergy to insect bites History of large localized reactions to fire ant in the past.  Continue avoidance and consider testing in the future.  Return in about 3 months (around 01/24/2020).  Meds ordered this encounter  Medications  . EPINEPHrine (AUVI-Q) 0.3 mg/0.3 mL IJ SOAJ injection    Sig: Inject 0.3 mLs (0.3 mg total) into the muscle as needed for anaphylaxis.    Dispense:  2 each    Refill:  1  . Azelastine-Fluticasone 137-50 MCG/ACT SUSP    Sig: Place 1 spray into the nose in the morning and at bedtime.    Dispense:  23 g    Refill:  5  . Olopatadine HCl 0.2 % SOLN    Sig: Apply 1 drop to eye daily as needed (itchy/watery eyes).    Dispense:  2.5 mL    Refill:  5  . Fluticasone Furoate (ARNUITY ELLIPTA) 100 MCG/ACT AEPB    Sig: Inhale 1 puff into the lungs daily.    Dispense:  30 each    Refill:  3    Lab Orders     CBC with Differential/Platelet     Complement, total     IgG, IgA, IgM     Strep pneumoniae 23 Serotypes IgG     Diphtheria / Tetanus Antibody Panel  Other allergy screening: Food allergy: yes  Dairy, wheat causes abdominal pains Limiting beef intake which causes abdominal pains and sometimes itching. Grapefruit - itching. Peanuts/tree nuts cause some pruritis.  Concern about avocado - pruritus?   Patient had IgG bloodwork which showed multiple positives per patient report.  Dietary History: patient has been eating other foods including eggs, sesame, shellfish, seafood, soy, meats, fruits and vegetables.  Medication allergy: yes  Hymenoptera allergy: yes  Fire ant - large localized reaction Urticaria: yes in the past with no triggers. Eczema:yes  Stable.  History of recurrent infections suggestive of immunodeficency: Patient has history  multiple infections including sinus infection, pneumonia, ear infections, GI infections/diarrhea. Denies any skin infections/abscesses. Patient has history of opportunistic infections including fungal infections, viral infections.   Patient reports 3-4 antibiotic use in the last 12 months and 0 hospital admissions. Patient does not have any secondary causes of immunodeficiency including chronic steroid use, diabetes mellitus, protein losing enteropathy, renal or hepatic dysfunction, history of cancer or irradiation or history of HIV, hepatitis B or C.  Diagnostics: Spirometry:  Tracings reviewed. Her effort: Good reproducible efforts. FVC: 3.07L FEV1: 2.46L, 91% predicted FEV1/FVC ratio: 80% Interpretation: Spirometry consistent with normal pattern with 7% improvement in FEV1 post bronchodilator treatment. Clinically feeling improved.  Please see scanned spirometry results for details.  Skin Testing: Environmental allergy panel and select foods. Positive to grass, weed, ragweed, trees, dust mites, cat, dog, mold.  Borderline to peanut, soy, almond, hazelnut, pistachio, cottonseed, sweet potato, peas, navy bean, orange.  Results discussed with patient/family. Airborne Adult Perc - 10/24/19 1044    Time Antigen Placed  1044    Allergen Manufacturer  Waynette Buttery    Location  Back    Number of Test  59    1. Control-Buffer 50% Glycerol  Negative    2. Control-Histamine 1 mg/ml  2+    3. Albumin saline  Negative    4. [place]  4+    5. [place]  4+    6. Johnson  3+    7. [place] Blue  4+    8. Meadow Fescue  4+    9. Perennial Rye  4+    10. Sweet Vernal  3+    11. Timothy  4+    12. Cocklebur  2+    13. Burweed Marshelder  2+    14. Ragweed, short  2+    15. Ragweed, Giant  2+    16. Plantain,  English  2+    17. Lamb's Quarters  2+    18. Sheep Sorrell  2+    19. Rough Pigweed  2+    20. Marsh Elder, Rough  2+    21. Mugwort, Common  2+    22. Ash mix  2+    23. Birch mix  2+     24. Beech American  3+    25. Box, Elder  3+    26. Cedar, red  3+    27. Cottonwood, Eastern  3+    28. Elm mix  2+    29. Hickory mix  4+    30. Maple mix  2+    31. Oak, [place] mix  4+    32. Pecan Pollen  2+    33. Pine mix  Negative    34. Sycamore Eastern  2+    35. Walnut, Black Pollen  2+    36. Alternaria alternata  Negative    37. Cladosporium Herbarum  Negative    38. Aspergillus mix  Negative    39. Penicillium mix  Negative    40. Bipolaris sorokiniana (Helminthosporium)  Negative    41. Drechslera spicifera (Curvularia)  Negative    42. Mucor plumbeus  Negative    43. Fusarium moniliforme  Negative    44. Aureobasidium pullulans (pullulara)  Negative    45. Rhizopus oryzae  Negative    46. Botrytis cinera  Negative    47. Epicoccum nigrum  Negative    48. Phoma betae  Negative    49. Candida Albicans  Negative    50. Trichophyton mentagrophytes  Negative    51. Mite, D Farinae  5,000 AU/ml  2+    52. Mite, D Pteronyssinus  5,000 AU/ml  2+    53. Cat Hair 10,000 BAU/ml  2+    54.  Dog Epithelia  Negative    55. Mixed Feathers  Negative    56. Horse Epithelia  Negative    57. Cockroach, German  Negative    58. Mouse  Negative    59. Tobacco Leaf  Negative     Intradermal - 10/24/19 1129    Time Antigen Placed  1129    Allergen Manufacturer  Other    Location  Arm    Number of Test  7    Control  Negative    Mold 1  Negative    Mold 2  Negative    Mold 3  Negative    Mold 4  2+    Dog  3+    Cockroach  Negative     Food Adult Perc - 10/24/19 1000    Time Antigen Placed  1044    Allergen Manufacturer  Waynette Buttery    Location  Back    Number of allergen test  72    1. Peanut  --   +/-   2. Soybean  --   +/-   3. Wheat  Negative    4. Sesame  Negative    5. Milk, cow  Negative    6. Egg White, Chicken  Negative    7. Casein  Negative    8. Shellfish Mix  Negative    9. Fish Mix  Negative    10. Cashew  Negative    11. Pecan Food  Negative    12.  Walnut Food  Negative    13. Almond  2+    14. Hazelnut  --   +/-   15. [place] nut  Negative    16. Coconut  Negative    17. Pistachio  --   +/-   18. Catfish  Negative    19. Bass  Negative    20. Trout  Negative    21. Tuna  Negative    22. Salmon  Negative    23. Flounder  Negative    24. Codfish  Negative    25. Shrimp  Negative    26. Crab  Negative    27. Lobster  Negative    28. Oyster  Negative    29. Scallops  Negative    30. Barley  Negative    31. Oat   Negative    32. Rye   Negative    33. Hops  Negative    34. Rice  Negative    35. Cottonseed  --   +/-   36. Saccharomyces Cerevisiae   Negative    37. Pork  Negative    38. [place] Meat  Negative    39. Chicken Meat  Negative    40. Beef  Negative    41. Lamb  Negative    42. Tomato  Negative    43. White Potato  Negative    44. Sweet Potato  --   +/-   45. Pea, Green/English  --   +/-   46. Navy Bean  --   5x2   47. Mushrooms  Negative    48. Avocado  Negative    49. Onion  Negative    50. Cabbage  Negative    51. Carrots  Negative    52. Celery  Negative    53. Corn  Negative    54. Cucumber  Negative    55. Grape (White seedless)  Negative    56. Orange   --   +/-   57. Banana  Negative    58. Apple  Negative    59. Peach  Negative    60. Strawberry  Negative    61. Cantaloupe  Negative    62. Watermelon  Negative    63. Pineapple  Negative    64. Chocolate/Cacao bean  Negative    65. Karaya Gum  Negative    66. Acacia (Arabic Gum)  Negative    67. Cinnamon  Negative    68. Nutmeg  Negative    69. Ginger  Negative    70. Garlic  Negative    71. Pepper, black  Negative    72. Mustard  Negative       Past Medical History: Patient Active Problem List   Diagnosis Date Noted  . Moderate persistent asthma without complication 10/24/2019  . Other allergic rhinitis 10/24/2019  . Pollen-food allergy 10/24/2019  . History of frequent upper respiratory infection 10/24/2019  . History of  urticaria 10/24/2019  . Other atopic dermatitis 10/24/2019  . Allergic conjunctivitis of both eyes 10/24/2019  . Hx of allergy to insect bites 10/24/2019  . Internal hemorrhoids with bleeding 04/10/2012  . GERD (gastroesophageal reflux disease)    Past Medical History:  Diagnosis Date  . Anxiety    not severe  . Asthma    mild  . Bronchitis, chronic (HCC)   . Complication of anesthesia    problems waking up after shoulder surgery  . Depression    not severe  . GERD (gastroesophageal reflux disease)   . Internal hemorrhoids with bleeding 04/10/2012  . Migraine headache   . PONV (postoperative nausea and vomiting)   . Reflux   . Seizures (HCC) as child   EEG done-MD said it was due to stress, none since age 54  . Varicose veins    with superficial clotting   Past Surgical History: Past Surgical History:  Procedure Laterality Date  . CLOSED MANIPULATION SHOULDER Right 2009  . FOOT SURGERY  2012   right plantar  . HEMANGIOMA EXCISION  1998   right hand  . HEMORRHOID BANDING  Sep 2013  . HEMORRHOID BANDING  Mar 2014  . KNEE SURGERY Right 2008   scope  . NASAL SEPTUM SURGERY  2006  . SHOULDER SURGERY  2009   right with clavical resection  . TRANSANAL HEMORRHOIDAL DEARTERIALIZATION N/A 03/12/2014   Procedure: THD HEMORRIHOIDAL LIGATION/PEXY EXAM UNDER ANESTHESIA, HEMORRHOIDECTOMY;  Surgeon: Ardeth SportsmanSteven C. Gross, MD;  Location: [facility];  Service: General;  Laterality: N/A;  . TYMPANOSTOMY TUBE PLACEMENT Left 1998   infected ear-removed in 2000  . vulvoplasty  1998   Medication List:  Current Outpatient Medications  Medication Sig Dispense Refill  . acetaminophen (TYLENOL) 500 MG tablet Take 1,000 mg by mouth every 6 (six) hours as needed for mild pain or headache.    . albuterol (PROVENTIL HFA;VENTOLIN HFA) 108 (90 BASE) MCG/ACT inhaler Inhale 1 puff into the lungs every 6 (six) hours as needed for wheezing or shortness of breath.    Marland Kitchen. atorvastatin (LIPITOR) 10 MG tablet Take by  mouth.    . Azelastine HCl (ASTEPRO) 0.15 % SOLN Place 2 sprays into both nostrils daily as needed (for allergies).    . busPIRone (BUSPAR) 10 MG tablet Take by mouth.    . calcium carbonate (OS-CAL) 600 MG TABS tablet Take by mouth.    . Calcium-Vitamin D-Vitamin K (CALCIUM SOFT CHEWS PO) Take 500 mg by mouth 2 (two) times daily. Gummy    . celecoxib (CELEBREX) 200 MG capsule Take by mouth.    . cetirizine (ZYRTEC) 10 MG tablet Take 10 mg by mouth daily.    . Cholecalciferol 25 MCG (1000 UT) capsule Take by mouth.    . cromolyn (OPTICROM) 4 % ophthalmic solution INSTILL 1 DROP INTO EACH EYE ONCE DAILY    . cyanocobalamin 1000 MCG tablet Take by mouth.    . diclofenac Sodium (VOLTAREN) 1 % GEL APPLY TO PAINFUL HAND UP TO 3 TIMES DAILY    . dicyclomine (BENTYL) 20 MG tablet TAKE 1 TABLET BY MOUTH 4 TIMES DAILY AS NEEDED    . Docusate Sodium (DSS) 100 MG CAPS Take by mouth.    . EPINEPHrine 0.3 mg/0.3 mL IJ SOAJ injection Inject 0.3 mg into the muscle once.    . famotidine (PEPCID) 40 MG tablet Take by mouth.    . Ferrous Sulfate (SLOW FE) 142 (45 Fe) MG TBCR Take by mouth.    .Marland Kitchen  fexofenadine (ALLEGRA) 180 MG tablet Take 180 mg by mouth daily.    . fluticasone (FLONASE) 50 MCG/ACT nasal spray Place 1 spray into both nostrils daily as needed for allergies or rhinitis.    . folic acid (FOLVITE) 800 MCG tablet Take by mouth.    Marland Kitchen ipratropium (ATROVENT) 0.03 % nasal spray Place into the nose.    Marland Kitchen ketotifen (ALAWAY) 0.025 % ophthalmic solution Place 1 drop into both eyes 2 (two) times daily as needed (for allergies).    . Lactobacillus Rhamnosus, GG, (RA PROBIOTIC DIGESTIVE CARE) CAPS Take by mouth.    . lubiprostone (AMITIZA) 24 MCG capsule Take by mouth.    . metroNIDAZOLE (METROCREAM) 0.75 % cream Apply topically.    . mometasone (ELOCON) 0.1 % cream For use on allergy injection sites as needed.    . montelukast (SINGULAIR) 10 MG tablet Take 10 mg by mouth at bedtime.    . Multiple Vitamin  (MULTIVITAMIN) tablet Take 1 tablet by mouth daily.    . polyethylene glycol powder (GLYCOLAX/MIRALAX) 17 GM/SCOOP powder Take by mouth.    . sertraline (ZOLOFT) 100 MG tablet Take by mouth.    . Soft Lens Products (REFRESH CONTACTS DROPS) SOLN Apply to eye.    . SUMAtriptan (IMITREX) 100 MG tablet 1 at tablet onset, may repeat once in 2 hours if needed    . tiZANidine (ZANAFLEX) 4 MG tablet Take by mouth.    . topiramate (TOPAMAX) 50 MG tablet Take 50-100 mg by mouth 2 (two) times daily.     . traZODone (DESYREL) 50 MG tablet Take 50 mg by mouth at bedtime.    . Azelastine-Fluticasone 137-50 MCG/ACT SUSP Place 1 spray into the nose in the morning and at bedtime. 23 g 5  . EPINEPHrine (AUVI-Q) 0.3 mg/0.3 mL IJ SOAJ injection Inject 0.3 mLs (0.3 mg total) into the muscle as needed for anaphylaxis. 2 each 1  . Fluticasone Furoate (ARNUITY ELLIPTA) 100 MCG/ACT AEPB Inhale 1 puff into the lungs daily. 30 each 3  . Olopatadine HCl 0.2 % SOLN Apply 1 drop to eye daily as needed (itchy/watery eyes). 2.5 mL 5   No current facility-administered medications for this visit.   Allergies: Allergies  Allergen Reactions  . Beef-Derived Products Hives and Other (See Comments)    Abdominal upset  . Grape Extract (Vitis Vinifera) [Vitis Viniferae] Hives and Other (See Comments)    Abdominal upset   . Ciprofloxacin   . Grapefruit Extract Other (See Comments)    Itch/hives  . Latex Other (See Comments)    Itch/burn/rash   . Miconazole   . Other Other (See Comments)    redness  . Septra [Sulfamethoxazole-Trimethoprim]   . Azithromycin Rash  . Diflucan [Fluconazole] Hives and Rash  . Sulfa Antibiotics Rash   Social History: Social History   Socioeconomic History  . Marital status: Legally Separated    Spouse name: Not on file  . Number of children: Not on file  . Years of education: Not on file  . Highest education level: Not on file  Occupational History  . Not on file  Tobacco Use  .  Smoking status: Former Smoker    Packs/day: 1.50    Years: 11.00    Pack years: 16.50    Types: Cigarettes    Quit date: 03/13/1995    Years since quitting: 24.6  . Smokeless tobacco: Never Used  Substance and Sexual Activity  . Alcohol use: No  . Drug use: No  .  Sexual activity: Not on file  Other Topics Concern  . Not on file  Social History Narrative  . Not on file   Social Determinants of Health   Financial Resource Strain:   . Difficulty of Paying Living Expenses:   Food Insecurity:   . Worried About Programme researcher, broadcasting/film/video in the Last Year:   . Barista in the Last Year:   Transportation Needs:   . Freight forwarder (Medical):   Marland Kitchen Lack of Transportation (Non-Medical):   Physical Activity:   . Days of Exercise per Week:   . Minutes of Exercise per Session:   Stress:   . Feeling of Stress :   Social Connections:   . Frequency of Communication with Friends and Family:   . Frequency of Social Gatherings with Friends and Family:   . Attends Religious Services:   . Active Member of Clubs or Organizations:   . Attends Banker Meetings:   Marland Kitchen Marital Status:    Lives in a double wide. Smoking: quit smoking Occupation: not employed  Environmental HistorySurveyor, minerals in the house: no Engineer, civil (consulting) in the family room: yes Carpet in the bedroom: yes Heating: electric Cooling: central Pet: yes 4 dogs, 2 cats  Family History: Family History  Problem Relation Age of Onset  . Prostate cancer Father   . Cancer Father        prostate/ skin  . Allergic rhinitis Father   . Eczema Father   . Urticaria Father   . Allergic rhinitis Mother   . Asthma Mother   . Emphysema Mother   . Eczema Mother   . Urticaria Mother   . Liver cancer Paternal Grandfather   . Cancer Paternal Grandfather        lung  . Cancer Paternal Aunt        breast  . Cancer Paternal Grandmother        breast  . Cancer Maternal Uncle        throat/skin  . Asthma Maternal  Uncle   . Eczema Maternal Uncle   . Asthma Maternal Aunt   . Eczema Maternal Aunt   . Allergic rhinitis Son   . Asthma Son   . Eczema Son   . Urticaria Son   . Food Allergy Son   . Allergic rhinitis Daughter   . Sinusitis Daughter   . Angioedema Neg Hx    Review of Systems  Constitutional: Negative for appetite change, chills, fever and unexpected weight change.  HENT: Positive for congestion, rhinorrhea and sneezing.   Eyes: Positive for itching.  Respiratory: Positive for shortness of breath. Negative for cough, chest tightness and wheezing.   Cardiovascular: Negative for chest pain.  Gastrointestinal: Positive for constipation. Negative for abdominal pain.  Genitourinary: Negative for difficulty urinating.  Skin: Negative for rash.  Allergic/Immunologic: Positive for environmental allergies and food allergies.  Neurological: Positive for headaches.   Objective: BP 122/80 (BP Location: Right Arm, Patient Position: Sitting, Cuff Size: Normal)   Pulse 75   Temp 98.4 F (36.9 C) (Oral)   Resp 16   Ht 5\' 3"  (1.6 m)   Wt 166 lb 6.4 oz (75.5 kg)   SpO2 100%   BMI 29.48 kg/m  Body mass index is 29.48 kg/m. Physical Exam  Constitutional: She is oriented to person, place, and time. She appears well-developed and well-nourished.  HENT:  Head: Normocephalic and atraumatic.  Right Ear: External ear normal.  Left Ear: External ear normal.  Nose: Nose normal.  Mouth/Throat: Oropharynx is clear and moist.  Eyes: Conjunctivae and EOM are normal.  Cardiovascular: Normal rate, regular rhythm and normal heart sounds. Exam reveals no gallop and no friction rub.  No murmur heard. Pulmonary/Chest: Effort normal and breath sounds normal. She has no wheezes. She has no rales.  Abdominal: Soft.  Musculoskeletal:     Cervical back: Neck supple.  Neurological: She is alert and oriented to person, place, and time.  Skin: Skin is warm. No rash noted.  Psychiatric: She has a normal mood  and affect. Her behavior is normal.  Nursing note and vitals reviewed.  The plan was reviewed with the patient/family, and all questions/concerned were addressed.  It was my pleasure to see Mattison today and participate in her care. Please feel free to contact me with any questions or concerns.  Sincerely,  Rexene Alberts, DO Allergy & Immunology  [facility] office: [phone] [facility] office: [place] office: [phone]  65 minutes spent face-to-face with more than 50% of the time spent discussing asthma, food and environmental allergies, frequent infections, eczema and history of hives.

## 2019-10-24 ENCOUNTER — Encounter: Payer: Self-pay | Admitting: Allergy

## 2019-10-24 ENCOUNTER — Ambulatory Visit: Payer: 59 | Admitting: Allergy

## 2019-10-24 ENCOUNTER — Other Ambulatory Visit: Payer: Self-pay

## 2019-10-24 VITALS — BP 122/80 | HR 75 | Temp 98.4°F | Resp 16 | Ht 63.0 in | Wt 166.4 lb

## 2019-10-24 DIAGNOSIS — Z8709 Personal history of other diseases of the respiratory system: Secondary | ICD-10-CM | POA: Diagnosis not present

## 2019-10-24 DIAGNOSIS — L2089 Other atopic dermatitis: Secondary | ICD-10-CM | POA: Insufficient documentation

## 2019-10-24 DIAGNOSIS — J3089 Other allergic rhinitis: Secondary | ICD-10-CM | POA: Diagnosis not present

## 2019-10-24 DIAGNOSIS — J4540 Moderate persistent asthma, uncomplicated: Secondary | ICD-10-CM | POA: Diagnosis not present

## 2019-10-24 DIAGNOSIS — T781XXA Other adverse food reactions, not elsewhere classified, initial encounter: Secondary | ICD-10-CM | POA: Insufficient documentation

## 2019-10-24 DIAGNOSIS — T781XXD Other adverse food reactions, not elsewhere classified, subsequent encounter: Secondary | ICD-10-CM

## 2019-10-24 DIAGNOSIS — Z872 Personal history of diseases of the skin and subcutaneous tissue: Secondary | ICD-10-CM

## 2019-10-24 DIAGNOSIS — H1013 Acute atopic conjunctivitis, bilateral: Secondary | ICD-10-CM | POA: Insufficient documentation

## 2019-10-24 DIAGNOSIS — Z91038 Other insect allergy status: Secondary | ICD-10-CM | POA: Insufficient documentation

## 2019-10-24 MED ORDER — ARNUITY ELLIPTA 100 MCG/ACT IN AEPB: 1.0000 | INHALATION_SPRAY | Freq: Every day | RESPIRATORY_TRACT | 3 refills | Status: DC

## 2019-10-24 MED ORDER — OLOPATADINE HCL 0.2 % OP SOLN: 1.0000 [drp] | Freq: Every day | OPHTHALMIC | 5 refills | Status: AC | PRN

## 2019-10-24 MED ORDER — EPINEPHRINE 0.3 MG/0.3ML IJ SOAJ: 0.3000 mg | INTRAMUSCULAR | 1 refills | Status: DC | PRN

## 2019-10-24 MED ORDER — AZELASTINE-FLUTICASONE 137-50 MCG/ACT NA SUSP: 1.0000 | Freq: Two times a day (BID) | NASAL | 5 refills | Status: AC

## 2019-10-24 NOTE — Assessment & Plan Note (Signed)
History of large localized reactions to fire ant in the past.  Continue avoidance and consider testing in the future.

## 2019-10-24 NOTE — Assessment & Plan Note (Addendum)
Perennial rhinoconjunctivitis symptoms for the past 20+ years with worsening in the spring and summer.  Patient has been on allergy immunotherapy on and off for about 10 years.  History of large localized reaction with one systemic reaction.  Last injection was in September 2020 at an outside facility.  Patient would like to continue allergy injections at our office.  She has tried various antihistamines, Singulair, Astelin, Flonase, Pataday, Pazeo, cromolyn, Alaway with some benefit.  Sinus surgery in the past as well.  Takes Pepcid for reflux.  Today's skin testing showed: Positive to grass, weed, ragweed, trees, dust mites, cat, dog, mold.   Start environmental control measures.  May use over the counter antihistamines such as Zyrtec (cetirizine), Claritin (loratadine), Allegra (fexofenadine), or Xyzal (levocetirizine) daily as needed. May take twice a day during allergy flare.   Continue Singulair 10mg  daily at night.   Start dymista (fluticasone + azelastine nasal spray combination) 1 spray per nostril twice a day.  This replaces all your other nasal sprays.   If it's not covered let know.   May use olopatadine eye drops 0.2% once a day as needed for itchy/watery eyes.  Had a detailed discussion with patient/family that clinical history is suggestive of allergic rhinitis, and may benefit from allergy immunotherapy (AIT). Discussed in detail regarding the dosing, schedule, side effects (mild to moderate local allergic reaction and rarely systemic allergic reactions including anaphylaxis), and benefits (significant improvement in nasal symptoms, seasonal flares of asthma) of immunotherapy with the patient. There is significant time commitment involved with allergy shots, which includes weekly immunotherapy injections for first 9-12 months and then biweekly to monthly injections for 3-5 years.   Start allergy injections.  Consent form was signed.

## 2019-10-24 NOTE — Assessment & Plan Note (Signed)
   See assessment and plan as above allergic rhinitis. 

## 2019-10-24 NOTE — Assessment & Plan Note (Signed)
History of eczema in the past.  Well-controlled and stable currently.  Continue proper skin care measures.

## 2019-10-24 NOTE — Assessment & Plan Note (Signed)
History of frequent upper respiratory infections including pneumonias.  Usually takes 3-4 courses of antibiotics per year.  No previous immune evaluation.  Keep track of infections.  We will get basic immune evaluation.

## 2019-10-24 NOTE — Patient Instructions (Addendum)
Today's skin testing showed: Positive to grass, weed, ragweed, trees, dust mites, cat, dog, mold.  Borderline to peanut, soy, almond, hazelnut, pistachio, cottonseed, sweet potato, peas, navy bean, orange.   Environmental allergies:  Start environmental control measures.  May use over the counter antihistamines such as Zyrtec (cetirizine), Claritin (loratadine), Allegra (fexofenadine), or Xyzal (levocetirizine) daily as needed. May take twice a day during allergy flare.   Continue Singulair 10mg  daily at night.   Start dymista (fluticasone + azelastine nasal spray combination) 1 spray per nostril twice a day.  This replaces all your other nasal sprays.   If it's not covered let know.   May use olopatadine eye drops 0.2% once a day as needed for itchy/watery eyes.  Had a detailed discussion with patient/family that clinical history is suggestive of allergic rhinitis, and may benefit from allergy immunotherapy (AIT). Discussed in detail regarding the dosing, schedule, side effects (mild to moderate local allergic reaction and rarely systemic allergic reactions including anaphylaxis), and benefits (significant improvement in nasal symptoms, seasonal flares of asthma) of immunotherapy with the patient. There is significant time commitment involved with allergy shots, which includes weekly immunotherapy injections for first 9-12 months and then biweekly to monthly injections for 3-5 years.   Start allergy injections.   Asthma: . Daily controller medication(s): start Arnuity 100 1 puff daily and rinse mouth afterwards. . May use albuterol rescue inhaler 2 puffs as needed for shortness of breath, chest tightness, coughing, and wheezing. May use albuterol rescue inhaler 2 puffs 5 to 15 minutes prior to strenuous physical activities. Monitor frequency of use.  . Asthma control goals:  o Full participation in all desired activities (may need albuterol before activity) o Albuterol use two times  or less a week on average (not counting use with activity) o Cough interfering with sleep two times or less a month o Oral steroids no more than once a year o No hospitalizations  Food:  Continue to avoid foods that bother you.   Keep a food journal.   Discussed that her food triggered oral and throat symptoms are likely caused by oral food allergy syndrome (OFAS). This is caused by cross reactivity of pollen with fresh fruits and vegetables, and nuts. Symptoms are usually localized in the form of itching and burning in mouth and throat. Very rarely it can progress to more severe symptoms. Eating foods in cooked or processed forms usually minimizes symptoms. I recommended avoidance of eating the problem foods, especially during the peak season(s). Sometimes, OFAS can induce severe throat swelling or even a systemic reaction; with such instance, I advised them to report to a local ER. A list of common pollens and food cross-reactivities was provided to the patient.   I have prescribed epinephrine injectable and demonstrated proper use. For mild symptoms you can take over the counter antihistamines such as Benadryl and monitor symptoms closely. If symptoms worsen or if you have severe symptoms including breathing issues, throat closure, significant swelling, whole body hives, severe diarrhea and vomiting, lightheadedness then inject epinephrine and seek immediate medical care afterwards.  Food action plan given  Infections: Get bloodwork:  We are ordering labs, so please allow 1-2 weeks for the results to come back. With the newly implemented Cures Act, the labs might be visible to you at the same time that they become visible to me. However, I will not address the results until all of the results are back, so please be patient.   Keep track of infections.  Follow up in 3 months or sooner if needed.   Reducing Pollen Exposure . Pollen seasons: trees (spring), grass (summer) and ragweed/weeds  (fall). Marland Kitchen Keep windows closed in your home and car to lower pollen exposure.  Lilian Kapur air conditioning in the bedroom and throughout the house if possible.  . Avoid going out in dry windy days - especially early morning. . Pollen counts are highest between 5 - 10 AM and on dry, hot and windy days.  . Save outside activities for late afternoon or after a heavy rain, when pollen levels are lower.  . Avoid mowing of grass if you have grass pollen allergy. Marland Kitchen Be aware that pollen can also be transported indoors on people and pets.  . Dry your clothes in an automatic dryer rather than hanging them outside where they might collect pollen.  . Rinse hair and eyes before bedtime. Control of House Dust Mite Allergen . Dust mite allergens are a common trigger of allergy and asthma symptoms. While they can be found throughout the house, these microscopic creatures thrive in warm, humid environments such as bedding, upholstered furniture and carpeting. . Because so much time is spent in the bedroom, it is essential to reduce mite levels there.  . Encase pillows, mattresses, and box springs in special allergen-proof fabric covers or airtight, zippered plastic covers.  . Bedding should be washed weekly in hot water (130 F) and dried in a hot dryer. Allergen-proof covers are available for comforters and pillows that can't be regularly washed.  Reyes Ivan the allergy-proof covers every few months. Minimize clutter in the bedroom. Keep pets out of the bedroom.  Marland Kitchen Keep humidity less than 50% by using a dehumidifier or air conditioning. You can buy a humidity measuring device called a hygrometer to monitor this.  . If possible, replace carpets with hardwood, linoleum, or washable area rugs. If that's not possible, vacuum frequently with a vacuum that has a HEPA filter. . Remove all upholstered furniture and non-washable window drapes from the bedroom. . Remove all non-washable stuffed toys from the bedroom.  Wash  stuffed toys weekly. Mold Control . Mold and fungi can grow on a variety of surfaces provided certain temperature and moisture conditions exist.  . Outdoor molds grow on plants, decaying vegetation and soil. The major outdoor mold, Alternaria and Cladosporium, are found in very high numbers during hot and dry conditions. Generally, a late summer - fall peak is seen for common outdoor fungal spores. Rain will temporarily lower outdoor mold spore count, but counts rise rapidly when the rainy period ends. . The most important indoor molds are Aspergillus and Penicillium. Dark, humid and poorly ventilated basements are ideal sites for mold growth. The next most common sites of mold growth are the bathroom and the kitchen. Outdoor (Seasonal) Mold Control . Use air conditioning and keep windows closed. . Avoid exposure to decaying vegetation. Marland Kitchen Avoid leaf raking. . Avoid grain handling. . Consider wearing a face mask if working in moldy areas.  Indoor (Perennial) Mold Control  . Maintain humidity below 50%. . Get rid of mold growth on hard surfaces with water, detergent and, if necessary, 5% bleach (do not mix with other cleaners). Then dry the area completely. If mold covers an area more than 10 square feet, consider hiring an indoor environmental professional. . For clothing, washing with soap and water is best. If moldy items cannot be cleaned and dried, throw them away. . Remove sources e.g. contaminated carpets. . Repair  and seal leaking roofs or pipes. Using dehumidifiers in damp basements may be helpful, but empty the water and clean units regularly to prevent mildew from forming. All rooms, especially basements, bathrooms and kitchens, require ventilation and cleaning to deter mold and mildew growth. Avoid carpeting on concrete or damp floors, and storing items in damp areas. Pet Allergen Avoidance: . Contrary to popular opinion, there are no "hypoallergenic" breeds of dogs or cats. That is  because people are not allergic to an animal's hair, but to an allergen found in the animal's saliva, dander (dead skin flakes) or urine. Pet allergy symptoms typically occur within minutes. For some people, symptoms can build up and become most severe 8 to 12 hours after contact with the animal. People with severe allergies can experience reactions in public places if dander has been transported on the pet owners' clothing. Marland Kitchen Keeping an animal outdoors is only a partial solution, since homes with pets in the yard still have higher concentrations of animal allergens. . Before getting a pet, ask your allergist to determine if you are allergic to animals. If your pet is already considered part of your family, try to minimize contact and keep the pet out of the bedroom and other rooms where you spend a great deal of time. . As with dust mites, vacuum carpets often or replace carpet with a hardwood floor, tile or linoleum. . High-efficiency particulate air (HEPA) cleaners can reduce allergen levels over time. . While dander and saliva are the source of cat and dog allergens, urine is the source of allergens from rabbits, hamsters, mice and [place] pigs; so ask a non-allergic family member to clean the animal's cage. . If you have a pet allergy, talk to your allergist about the potential for allergy immunotherapy (allergy shots). This strategy can often provide long-term relief.

## 2019-10-24 NOTE — Assessment & Plan Note (Signed)
Patient is concerned about food allergies as she has perioral itching to various foods including gray food, peanuts, tree nuts, avocado, certain foods.  Patient had IgG blood work which showed multiple positives per patient report by a naturopath. 2019 IgE blood work was positive to wheat, peanut, soy, hazelnut, almonds, cashews, scallop, sesame and walnut.  Today's skin testing showed: Borderline to peanut, soy, almond, hazelnut, pistachio, cottonseed, sweet potato, peas, navy bean, orange.   Continue to avoid foods that bother you.   Keep a food journal.   Discussed that her food triggered oral and throat symptoms are likely caused by oral food allergy syndrome (OFAS). This is caused by cross reactivity of pollen with fresh fruits and vegetables, and nuts. Symptoms are usually localized in the form of itching and burning in mouth and throat. Very rarely it can progress to more severe symptoms. Eating foods in cooked or processed forms usually minimizes symptoms. I recommended avoidance of eating the problem foods, especially during the peak season(s). Sometimes, OFAS can induce severe throat swelling or even a systemic reaction; with such instance, I advised them to report to a local ER. A list of common pollens and food cross-reactivities was provided to the patient.   I have prescribed epinephrine injectable and demonstrated proper use. For mild symptoms you can take over the counter antihistamines such as Benadryl and monitor symptoms closely. If symptoms worsen or if you have severe symptoms including breathing issues, throat closure, significant swelling, whole body hives, severe diarrhea and vomiting, lightheadedness then inject epinephrine and seek immediate medical care afterwards.  Food action plan given

## 2019-10-24 NOTE — Assessment & Plan Note (Signed)
Diagnosed with asthma over 30 years ago and currently using albuterol as needed.  Usually flares during the spring and summer and using albuterol 3-4 times a week lately.  No prior ICS inhaler.  Patient is an ex-smoker.  Today's spirometry was normal with 7% improvement in FEV1 post bronchodilator treatment.  Patient clinically feeling better. . Daily controller medication(s): start Arnuity 100 1 puff daily and rinse mouth afterwards. Most likely will need to use during the spring and summer months when asthma flares.  . May use albuterol rescue inhaler 2 puffs as needed for shortness of breath, chest tightness, coughing, and wheezing. May use albuterol rescue inhaler 2 puffs 5 to 15 minutes prior to strenuous physical activities. Monitor frequency of use.  . Get spirometry at next visit.

## 2019-10-24 NOTE — Assessment & Plan Note (Signed)
History of hives in the past with no known triggers.  Monitor symptoms and if they start to recur then we will do some additional blood work at that time.

## 2019-10-27 ENCOUNTER — Other Ambulatory Visit: Payer: Self-pay

## 2019-10-27 MED ORDER — FLUTICASONE PROPIONATE 50 MCG/ACT NA SUSP: 2.0000 | Freq: Every day | NASAL | 5 refills | Status: DC

## 2019-10-27 MED ORDER — AZELASTINE HCL 0.1 % NA SOLN: 2.0000 | Freq: Two times a day (BID) | NASAL | 5 refills | Status: DC

## 2019-10-28 ENCOUNTER — Telehealth: Payer: Self-pay | Admitting: Allergy

## 2019-10-28 DIAGNOSIS — J301 Allergic rhinitis due to pollen: Secondary | ICD-10-CM | POA: Diagnosis not present

## 2019-10-28 NOTE — Progress Notes (Addendum)
REVISED EXP DATE. VIALS EXP 10-27-20

## 2019-10-28 NOTE — Telephone Encounter (Signed)
Patient states that on the 26th when she got home, the two bottom IDs (Mold 3 and Cockroach) were very raised, red and itchy. Patient has a picture and will upload it to Mychart once I help her get it set up. Patient is just wanting to let us know and see what your thoughts are on it being a delayed positive reaction. She is also wondering if we should add those two things into her allergy injections.

## 2019-10-28 NOTE — Telephone Encounter (Signed)
Patient had allergy testing on Friday 3/26 and is now having a delayed reaction to her testing.  She mentioned that she has taken pictures of the affected area and wanted to speak to someone about the reaction.

## 2019-10-29 DIAGNOSIS — J3089 Other allergic rhinitis: Secondary | ICD-10-CM

## 2019-10-29 NOTE — Telephone Encounter (Signed)
Please call patient back. Reviewed picture.  I'm not going to include those 2 as for allergy testing we are looking for the immediate type of reaction.

## 2019-10-29 NOTE — Telephone Encounter (Signed)
Left message on voicemail for patient to return call. 

## 2019-10-30 ENCOUNTER — Telehealth: Payer: Self-pay

## 2019-10-30 LAB — DIPHTHERIA / TETANUS ANTIBODY PANEL
Diphtheria Ab: 0.23 IU/mL (ref <0.10)
Tetanus Ab, IgG: 1.58 IU/mL (ref <0.10)

## 2019-10-30 LAB — CBC WITH DIFFERENTIAL/PLATELET
Basophils Absolute: 0.1 10*3/uL (ref 0.0–0.2)
Basos: 1 %
EOS (ABSOLUTE): 0 10*3/uL (ref 0.0–0.4)
Eos: 1 %
Hematocrit: 44.3 % (ref 34.0–46.6)
Hemoglobin: 14.9 g/dL (ref 11.1–15.9)
Immature Grans (Abs): 0 10*3/uL (ref 0.0–0.1)
Immature Granulocytes: 1 %
Lymphocytes Absolute: 1.4 10*3/uL (ref 0.7–3.1)
Lymphs: 19 %
MCH: 32.5 pg (ref 26.6–33.0)
MCHC: 33.6 g/dL (ref 31.5–35.7)
MCV: 97 fL (ref 79–97)
Monocytes Absolute: 0.4 10*3/uL (ref 0.1–0.9)
Monocytes: 5 %
Neutrophils Absolute: 5.4 10*3/uL (ref 1.4–7.0)
Neutrophils: 73 %
Platelets: 292 10*3/uL (ref 150–450)
RBC: 4.58 x10E6/uL (ref 3.77–5.28)
RDW: 12.6 % (ref 11.7–15.4)
WBC: 7.3 10*3/uL (ref 3.4–10.8)

## 2019-10-30 LAB — STREP PNEUMONIAE 23 SEROTYPES IGG
Pneumo Ab Type 1*: 0.2 ug/mL — ABNORMAL LOW (ref >1.3)
Pneumo Ab Type 12 (12F)*: 0.2 ug/mL — ABNORMAL LOW (ref >1.3)
Pneumo Ab Type 14*: 2.2 ug/mL (ref >1.3)
Pneumo Ab Type 17 (17F)*: 1.5 ug/mL (ref >1.3)
Pneumo Ab Type 19 (19F)*: 0.8 ug/mL — ABNORMAL LOW (ref >1.3)
Pneumo Ab Type 2*: 0.2 ug/mL — ABNORMAL LOW (ref >1.3)
Pneumo Ab Type 20*: 0.3 ug/mL — ABNORMAL LOW (ref >1.3)
Pneumo Ab Type 22 (22F)*: 0.2 ug/mL — ABNORMAL LOW (ref >1.3)
Pneumo Ab Type 23 (23F)*: 1 ug/mL — ABNORMAL LOW (ref >1.3)
Pneumo Ab Type 26 (6B)*: 0.2 ug/mL — ABNORMAL LOW (ref >1.3)
Pneumo Ab Type 3*: 1.3 ug/mL — ABNORMAL LOW (ref >1.3)
Pneumo Ab Type 34 (10A)*: 0.3 ug/mL — ABNORMAL LOW (ref >1.3)
Pneumo Ab Type 4*: <0.1 ug/mL — ABNORMAL LOW (ref >1.3)
Pneumo Ab Type 43 (11A)*: 0.7 ug/mL — ABNORMAL LOW (ref >1.3)
Pneumo Ab Type 5*: <0.1 ug/mL — ABNORMAL LOW (ref >1.3)
Pneumo Ab Type 51 (7F)*: 1.5 ug/mL (ref >1.3)
Pneumo Ab Type 54 (15B)*: 0.1 ug/mL — ABNORMAL LOW (ref >1.3)
Pneumo Ab Type 56 (18C)*: 1.2 ug/mL — ABNORMAL LOW (ref >1.3)
Pneumo Ab Type 57 (19A)*: 1.9 ug/mL (ref >1.3)
Pneumo Ab Type 68 (9V)*: 0.2 ug/mL — ABNORMAL LOW (ref >1.3)
Pneumo Ab Type 70 (33F)*: 0.2 ug/mL — ABNORMAL LOW (ref >1.3)
Pneumo Ab Type 8*: 0.2 ug/mL — ABNORMAL LOW (ref >1.3)
Pneumo Ab Type 9 (9N)*: <0.1 ug/mL — ABNORMAL LOW (ref >1.3)

## 2019-10-30 LAB — IGG, IGA, IGM
IgA/Immunoglobulin A, Serum: 185 mg/dL (ref 87–352)
IgG (Immunoglobin G), Serum: 604 mg/dL (ref 586–1602)
IgM (Immunoglobulin M), Srm: 81 mg/dL (ref 26–217)

## 2019-10-30 LAB — COMPLEMENT, TOTAL: Compl, Total (CH50): >60 U/mL (ref >41)

## 2019-10-30 MED ORDER — FLUTICASONE PROPIONATE 50 MCG/ACT NA SUSP: NASAL | 5 refills | Status: AC

## 2019-10-30 MED ORDER — AZELASTINE HCL 0.1 % NA SOLN: NASAL | 5 refills | Status: AC

## 2019-10-30 NOTE — Telephone Encounter (Signed)
Submitted PA request for azelastine-fluticasone nasal spray on 10/29/19.  Received denial letter via fax on 10/30/2019. Per patient request, she spoke with [facility] today.  They told her to ask [place] to resubmit the PA and note that the provider feels patient would benefit from one nose spray rather than two bottles of each medication. This was documented in first PA but a second PA was re-submitted today with this information on form.

## 2019-10-30 NOTE — Addendum Note (Signed)
Addended byClyda Greener M on: 10/30/2019 03:15 PM   Modules accepted: Orders

## 2019-10-30 NOTE — Telephone Encounter (Signed)
Patient returned call.  Gave information.  Also, both nasal sprays (azelastine and fluticasone nasal spray) were called into the wrong pharmacy at patients last OV.  Please sent to [facility] on [address] in [place], [place].  Sent in rx.

## 2019-10-30 NOTE — Telephone Encounter (Signed)
Left message x 2 for patient to call clinic.

## 2019-11-14 ENCOUNTER — Telehealth: Payer: Self-pay | Admitting: Allergy

## 2019-11-14 DIAGNOSIS — Z8709 Personal history of other diseases of the respiratory system: Secondary | ICD-10-CM

## 2019-11-14 NOTE — Telephone Encounter (Signed)
I called and spoke to patient regarding her lab results. She stated she did see the message that was sent to her on MyChart and got the Pneumonia shot on 11/03/19.

## 2019-11-14 NOTE — Telephone Encounter (Signed)
Noted.  Please have her get pneumoccal titers drawn 4 weeks after the vaccine.  Mail lab order.  Thank you.

## 2019-11-14 NOTE — Telephone Encounter (Signed)
Patient is calling about test results.

## 2019-11-14 NOTE — Telephone Encounter (Signed)
I called and spoke with the patient. Lab requisition has been printed and will be mailed to her home. Patient verbalized understanding and will get blood drawn 4 weeks from the date of her vaccine.

## 2019-11-18 ENCOUNTER — Ambulatory Visit: Payer: 59

## 2019-12-09 ENCOUNTER — Ambulatory Visit: Payer: 59

## 2019-12-10 LAB — STREP PNEUMONIAE 23 SEROTYPES IGG
Pneumo Ab Type 1*: 4.8 ug/mL (ref >1.3)
Pneumo Ab Type 12 (12F)*: 1.4 ug/mL (ref >1.3)
Pneumo Ab Type 14*: >30.6 ug/mL (ref >1.3)
Pneumo Ab Type 17 (17F)*: 8.3 ug/mL (ref >1.3)
Pneumo Ab Type 19 (19F)*: 19.7 ug/mL (ref >1.3)
Pneumo Ab Type 2*: >21.5 ug/mL (ref >1.3)
Pneumo Ab Type 20*: 9.6 ug/mL (ref >1.3)
Pneumo Ab Type 22 (22F)*: 13.8 ug/mL (ref >1.3)
Pneumo Ab Type 23 (23F)*: 9.4 ug/mL (ref >1.3)
Pneumo Ab Type 26 (6B)*: 1.8 ug/mL (ref >1.3)
Pneumo Ab Type 3*: 11.8 ug/mL (ref >1.3)
Pneumo Ab Type 34 (10A)*: 3.9 ug/mL (ref >1.3)
Pneumo Ab Type 4*: 1.5 ug/mL (ref >1.3)
Pneumo Ab Type 43 (11A)*: 5.4 ug/mL (ref >1.3)
Pneumo Ab Type 5*: 4.6 ug/mL (ref >1.3)
Pneumo Ab Type 51 (7F)*: >25.5 ug/mL (ref >1.3)
Pneumo Ab Type 54 (15B)*: 2 ug/mL (ref >1.3)
Pneumo Ab Type 56 (18C)*: >14.4 ug/mL (ref >1.3)
Pneumo Ab Type 57 (19A)*: 24.1 ug/mL (ref >1.3)
Pneumo Ab Type 68 (9V)*: 7 ug/mL (ref >1.3)
Pneumo Ab Type 70 (33F)*: >13.6 ug/mL (ref >1.3)
Pneumo Ab Type 8*: >25.3 ug/mL (ref >1.3)
Pneumo Ab Type 9 (9N)*: 13.2 ug/mL (ref >1.3)

## 2019-12-24 ENCOUNTER — Telehealth: Payer: Self-pay | Admitting: *Deleted

## 2019-12-24 ENCOUNTER — Ambulatory Visit (INDEPENDENT_AMBULATORY_CARE_PROVIDER_SITE_OTHER): Payer: 59 | Admitting: *Deleted

## 2019-12-24 ENCOUNTER — Other Ambulatory Visit: Payer: Self-pay

## 2019-12-24 DIAGNOSIS — J309 Allergic rhinitis, unspecified: Secondary | ICD-10-CM

## 2019-12-24 MED ORDER — PREDNISONE 10 MG PO TABS: ORAL_TABLET | ORAL | 0 refills | Status: DC

## 2019-12-24 NOTE — Telephone Encounter (Signed)
Okay to give baby pak.  Start prednisone taper. Prednisone 10mg  tablets - take 2 tablets for 4 days then 1 tablet on day 5.

## 2019-12-24 NOTE — Progress Notes (Signed)
Immunotherapy   Patient Details  Name: Elen Acero MRN: [id number] Date of Birth: [birth year]  12/24/2019  Karen Kays started injections for  dustmite-mold-cat-dog and grass-weed-ragweed-tree Following schedule: A  Frequency:1 time per week Epi-Pen:Epi-Pen Available  Consent signed and patient instructions given.   Maurine Simmering 12/24/2019, 9:39 AM

## 2019-12-24 NOTE — Addendum Note (Signed)
Addended byClyda Greener M on: 12/24/2019 03:14 PM   Modules accepted: Orders

## 2019-12-24 NOTE — Telephone Encounter (Signed)
Sent in Prednisone pack. Left message on VM that prednisone was sent in.

## 2019-12-24 NOTE — Telephone Encounter (Signed)
Jamie Sweeney came in for her first allergy injection today. She is wondering if she can get a round of prednisone because has been having sneezing fits, runny/crusty eyes, itchy ears for awhile now. She takes zyrtec daily but she has had these symptoms since spring started and wants to know if she can get prednisone to help knock it out.

## 2020-01-20 ENCOUNTER — Ambulatory Visit (INDEPENDENT_AMBULATORY_CARE_PROVIDER_SITE_OTHER): Payer: 59 | Admitting: *Deleted

## 2020-01-20 DIAGNOSIS — J309 Allergic rhinitis, unspecified: Secondary | ICD-10-CM | POA: Diagnosis not present

## 2020-01-29 ENCOUNTER — Ambulatory Visit (INDEPENDENT_AMBULATORY_CARE_PROVIDER_SITE_OTHER): Payer: 59

## 2020-01-29 DIAGNOSIS — J309 Allergic rhinitis, unspecified: Secondary | ICD-10-CM

## 2020-02-05 NOTE — Progress Notes (Deleted)
Follow Up Note  RE: Jamie Sweeney MRN: [id number] DOB: 04/03/1966 Date of Office Visit: 02/06/2020  Referring provider: Stoney Bang, PA-C Primary care provider: Stoney Bang, PA-C  Chief Complaint: No chief complaint on file.  History of Present Illness: I had the pleasure of seeing Jamie Sweeney for a follow up visit at the [facility] on 02/05/2020. She is a 54 y.o. female, who is being followed for allergic rhinoconjunctivitis on immunotherapy, asthma, oral allergy syndrome, history of frequent upper respiratory infections, atopic dermatitis. Her previous allergy office visit was on 10/24/2019 with Dr. Selena Batten. Today is a regular follow up visit.  Other allergic rhinitis Perennial rhinoconjunctivitis symptoms for the past 20+ years with worsening in the spring and summer.  Patient has been on allergy immunotherapy on and off for about 10 years.  History of large localized reaction with one systemic reaction.  Last injection was in September 2020 at an outside facility.  Patient would like to continue allergy injections at our office.  She has tried various antihistamines, Singulair, Astelin, Flonase, Pataday, Pazeo, cromolyn, Alaway with some benefit.  Sinus surgery in the past as well.  Takes Pepcid for reflux.  Today's skin testing showed: Positive to grass, weed, ragweed, trees, dust mites, cat, dog, mold.   Start environmental control measures.  May use over the counter antihistamines such as Zyrtec (cetirizine), Claritin (loratadine), Allegra (fexofenadine), or Xyzal (levocetirizine) daily as needed. May take twice a day during allergy flare.   Continue Singulair 10mg  daily at night.   Start dymista (fluticasone + azelastine nasal spray combination) 1 spray per nostril twice a day. ? This replaces all your other nasal sprays.  ? If it's not covered let know.   May use olopatadine eye drops 0.2% once a day as needed for itchy/watery eyes.  Had a detailed  discussion with patient/family that clinical history is suggestive of allergic rhinitis, and may benefit from allergy immunotherapy (AIT). Discussed in detail regarding the dosing, schedule, side effects (mild to moderate local allergic reaction and rarely systemic allergic reactions including anaphylaxis), and benefits (significant improvement in nasal symptoms, seasonal flares of asthma) of immunotherapy with the patient. There is significant time commitment involved with allergy shots, which includes weekly immunotherapy injections for first 9-12 months and then biweekly to monthly injections for 3-5 years.   Start allergy injections.  Consent form was signed.  Allergic conjunctivitis of both eyes  See assessment and plan as above allergic rhinitis.  Moderate persistent asthma without complication Diagnosed with asthma over 30 years ago and currently using albuterol as needed.  Usually flares during the spring and summer and using albuterol 3-4 times a week lately.  No prior ICS inhaler.  Patient is an ex-smoker.  Today's spirometry was normal with 7% improvement in FEV1 post bronchodilator treatment.  Patient clinically feeling better.  Daily controller medication(s):start Arnuity 100 1 puff daily and rinse mouth afterwards. Most likely will need to use during the spring and summer months when asthma flares.   May use albuterol rescue inhaler 2 puffs as needed for shortness of breath, chest tightness, coughing, and wheezing. May use albuterol rescue inhaler 2 puffs 5 to 15 minutes prior to strenuous physical activities. Monitor frequency of use.   Get spirometry at next visit.  Pollen-food allergy Patient is concerned about food allergies as she has perioral itching to various foods including gray food, peanuts, tree nuts, avocado, certain foods.  Patient had IgG blood work which showed multiple positives  per patient report by a naturopath. 2019 IgE blood work was positive to wheat,  peanut, soy, hazelnut, almonds, cashews, scallop, sesame and walnut.  Today's skin testing showed: Borderline to peanut, soy, almond, hazelnut, pistachio, cottonseed, sweet potato, peas, navy bean, orange.   Continue to avoid foods that bother you.   Keep a food journal.   Discussed that her food triggered oral and throat symptoms are likely caused by oral food allergy syndrome (OFAS). This is caused by cross reactivity of pollen with fresh fruits and vegetables, and nuts. Symptoms are usually localized in the form of itching and burning in mouth and throat. Very rarely it can progress to more severe symptoms. Eating foods in cooked or processed forms usually minimizes symptoms. I recommended avoidance of eating the problem foods, especially during the peak season(s). Sometimes, OFAS can induce severe throat swelling or even a systemic reaction; with such instance, I advised them to report to a local ER. A list of common pollens and food cross-reactivities was provided to the patient.   I have prescribed epinephrine injectable and demonstrated proper use. For mild symptoms you can take over the counter antihistamines such as Benadryl and monitor symptoms closely. If symptoms worsen or if you have severe symptoms including breathing issues, throat closure, significant swelling, whole body hives, severe diarrhea and vomiting, lightheadedness then inject epinephrine and seek immediate medical care afterwards.  Food action plan given  History of frequent upper respiratory infection History of frequent upper respiratory infections including pneumonias.  Usually takes 3-4 courses of antibiotics per year.  No previous immune evaluation.  Keep track of infections.  We will get basic immune evaluation.  Other atopic dermatitis History of eczema in the past.  Well-controlled and stable currently.  Continue proper skin care measures.  History of urticaria History of hives in the past with no known  triggers.  Monitor symptoms and if they start to recur then we will do some additional blood work at that time.  Hx of allergy to insect bites History of large localized reactions to fire ant in the past.  Continue avoidance and consider testing in the future.  Return in about 3 months (around 01/24/2020).  Assessment and Plan: Jamie Sweeney is a 54 y.o. female with: No problem-specific Assessment & Plan notes found for this encounter.  No follow-ups on file.  No orders of the defined types were placed in this encounter.  Lab Orders  No laboratory test(s) ordered today    Diagnostics: Spirometry:  Tracings reviewed. Her effort: {Blank single:19197::"Good reproducible efforts.","It was hard to get consistent efforts and there is a question as to whether this reflects a maximal maneuver.","Poor effort, data can not be interpreted."} FVC: ***L FEV1: ***L, ***% predicted FEV1/FVC ratio: ***% Interpretation: {Blank single:19197::"Spirometry consistent with mild obstructive disease","Spirometry consistent with moderate obstructive disease","Spirometry consistent with severe obstructive disease","Spirometry consistent with possible restrictive disease","Spirometry consistent with mixed obstructive and restrictive disease","Spirometry uninterpretable due to technique","Spirometry consistent with normal pattern","No overt abnormalities noted given today's efforts"}.  Please see scanned spirometry results for details.  Skin Testing: {Blank single:19197::"Select foods","Environmental allergy panel","Environmental allergy panel and select foods","Food allergy panel","None","Deferred due to recent antihistamines use"}. Positive test to: ***. Negative test to: ***.  Results discussed with patient/family.   Medication List:  Current Outpatient Medications  Medication Sig Dispense Refill  . acetaminophen (TYLENOL) 500 MG tablet Take 1,000 mg by mouth every 6 (six) hours as needed for mild pain or  headache.    . albuterol (PROVENTIL HFA;VENTOLIN HFA)  108 (90 BASE) MCG/ACT inhaler Inhale 1 puff into the lungs every 6 (six) hours as needed for wheezing or shortness of breath.    Marland Kitchen. atorvastatin (LIPITOR) 10 MG tablet Take by mouth.    Marland Kitchen. azelastine (ASTELIN) 0.1 % nasal spray Two sprays each nostril twice a day if needed for runny nose or drainage. 30 mL 5  . Azelastine-Fluticasone 137-50 MCG/ACT SUSP Place 1 spray into the nose in the morning and at bedtime. 23 g 5  . busPIRone (BUSPAR) 10 MG tablet Take by mouth.    . calcium carbonate (OS-CAL) 600 MG TABS tablet Take by mouth.    . Calcium-Vitamin D-Vitamin K (CALCIUM SOFT CHEWS PO) Take 500 mg by mouth 2 (two) times daily. Gummy    . cetirizine (ZYRTEC) 10 MG tablet Take 10 mg by mouth daily.    . Cholecalciferol 25 MCG (1000 UT) capsule Take by mouth.    . cromolyn (OPTICROM) 4 % ophthalmic solution INSTILL 1 DROP INTO EACH EYE ONCE DAILY    . cyanocobalamin 1000 MCG tablet Take by mouth.    . diclofenac Sodium (VOLTAREN) 1 % GEL APPLY TO PAINFUL HAND UP TO 3 TIMES DAILY    . dicyclomine (BENTYL) 20 MG tablet TAKE 1 TABLET BY MOUTH 4 TIMES DAILY AS NEEDED    . Docusate Sodium (DSS) 100 MG CAPS Take by mouth.    . EPINEPHrine (AUVI-Q) 0.3 mg/0.3 mL IJ SOAJ injection Inject 0.3 mLs (0.3 mg total) into the muscle as needed for anaphylaxis. 2 each 1  . EPINEPHrine 0.3 mg/0.3 mL IJ SOAJ injection Inject 0.3 mg into the muscle once.    . famotidine (PEPCID) 40 MG tablet Take by mouth.    . Ferrous Sulfate (SLOW FE) 142 (45 Fe) MG TBCR Take by mouth.    . fexofenadine (ALLEGRA) 180 MG tablet Take 180 mg by mouth daily.    . fluticasone (FLONASE) 50 MCG/ACT nasal spray Two sprays each nostril once a day if needed for nasal congestion. 16 mL 5  . Fluticasone Furoate (ARNUITY ELLIPTA) 100 MCG/ACT AEPB Inhale 1 puff into the lungs daily. 30 each 3  . folic acid (FOLVITE) 800 MCG tablet Take by mouth.    Marland Kitchen. ipratropium (ATROVENT) 0.03 % nasal spray  Place into the nose.    Marland Kitchen. ketotifen (ALAWAY) 0.025 % ophthalmic solution Place 1 drop into both eyes 2 (two) times daily as needed (for allergies).    . Lactobacillus Rhamnosus, GG, (RA PROBIOTIC DIGESTIVE CARE) CAPS Take by mouth.    . lubiprostone (AMITIZA) 24 MCG capsule Take by mouth.    . metroNIDAZOLE (METROCREAM) 0.75 % cream Apply topically.    . mometasone (ELOCON) 0.1 % cream For use on allergy injection sites as needed.    . montelukast (SINGULAIR) 10 MG tablet Take 10 mg by mouth at bedtime.    . Multiple Vitamin (MULTIVITAMIN) tablet Take 1 tablet by mouth daily.    . Olopatadine HCl 0.2 % SOLN Apply 1 drop to eye daily as needed (itchy/watery eyes). 2.5 mL 5  . polyethylene glycol powder (GLYCOLAX/MIRALAX) 17 GM/SCOOP powder Take by mouth.    . predniSONE (DELTASONE) 10 MG tablet Take 2 tablets for 4 days, then one tablet on day 5, then stop 9 tablet 0  . sertraline (ZOLOFT) 100 MG tablet Take by mouth.    . Soft Lens Products (REFRESH CONTACTS DROPS) SOLN Apply to eye.    . SUMAtriptan (IMITREX) 100 MG tablet 1 at tablet onset, may  repeat once in 2 hours if needed    . topiramate (TOPAMAX) 50 MG tablet Take 50-100 mg by mouth 2 (two) times daily.     . traZODone (DESYREL) 50 MG tablet Take 50 mg by mouth at bedtime.     No current facility-administered medications for this visit.   Allergies: Allergies  Allergen Reactions  . Beef-Derived Products Hives and Other (See Comments)    Abdominal upset  . Grape Extract (Vitis Vinifera) [Vitis Viniferae] Hives and Other (See Comments)    Abdominal upset   . Ciprofloxacin   . Grapefruit Extract Other (See Comments)    Itch/hives  . Latex Other (See Comments)    Itch/burn/rash   . Miconazole   . Other Other (See Comments)    redness  . Septra [Sulfamethoxazole-Trimethoprim]   . Azithromycin Rash  . Diflucan [Fluconazole] Hives and Rash  . Sulfa Antibiotics Rash   I reviewed her past medical history, social history, family  history, and environmental history and no significant changes have been reported from her previous visit.  Review of Systems  Constitutional: Negative for appetite change, chills, fever and unexpected weight change.  HENT: Positive for congestion, rhinorrhea and sneezing.   Eyes: Positive for itching.  Respiratory: Positive for shortness of breath. Negative for cough, chest tightness and wheezing.   Cardiovascular: Negative for chest pain.  Gastrointestinal: Positive for constipation. Negative for abdominal pain.  Genitourinary: Negative for difficulty urinating.  Skin: Negative for rash.  Allergic/Immunologic: Positive for environmental allergies and food allergies.  Neurological: Positive for headaches.   Objective: There were no vitals taken for this visit. There is no height or weight on file to calculate BMI. Physical Exam Vitals and nursing note reviewed.  Constitutional:      Appearance: She is well-developed.  HENT:     Head: Normocephalic and atraumatic.     Right Ear: External ear normal.     Left Ear: External ear normal.     Nose: Nose normal.  Eyes:     Conjunctiva/sclera: Conjunctivae normal.  Cardiovascular:     Rate and Rhythm: Normal rate and regular rhythm.     Heart sounds: Normal heart sounds. No murmur heard.  No friction rub. No gallop.   Pulmonary:     Effort: Pulmonary effort is normal.     Breath sounds: Normal breath sounds. No wheezing or rales.  Abdominal:     Palpations: Abdomen is soft.  Musculoskeletal:     Cervical back: Neck supple.  Skin:    General: Skin is warm.     Findings: No rash.  Neurological:     Mental Status: She is alert and oriented to person, place, and time.  Psychiatric:        Behavior: Behavior normal.    Previous notes and tests were reviewed. The plan was reviewed with the patient/family, and all questions/concerned were addressed.  It was my pleasure to see Jamie Sweeney today and participate in her care. Please feel  free to contact me with any questions or concerns.  Sincerely,  Wyline Mood, DO Allergy & Immunology  [facility] office: [phone] [facility] office: [phone] [place] office: [phone]

## 2020-02-06 ENCOUNTER — Ambulatory Visit: Payer: 59 | Admitting: Allergy

## 2020-02-11 ENCOUNTER — Ambulatory Visit (INDEPENDENT_AMBULATORY_CARE_PROVIDER_SITE_OTHER): Payer: Self-pay

## 2020-02-11 DIAGNOSIS — J309 Allergic rhinitis, unspecified: Secondary | ICD-10-CM

## 2020-02-23 ENCOUNTER — Ambulatory Visit (INDEPENDENT_AMBULATORY_CARE_PROVIDER_SITE_OTHER): Payer: Self-pay

## 2020-02-23 DIAGNOSIS — J309 Allergic rhinitis, unspecified: Secondary | ICD-10-CM

## 2020-03-04 ENCOUNTER — Ambulatory Visit (INDEPENDENT_AMBULATORY_CARE_PROVIDER_SITE_OTHER): Payer: BLUE CROSS/BLUE SHIELD

## 2020-03-04 DIAGNOSIS — J309 Allergic rhinitis, unspecified: Secondary | ICD-10-CM | POA: Diagnosis not present

## 2020-03-11 ENCOUNTER — Ambulatory Visit (INDEPENDENT_AMBULATORY_CARE_PROVIDER_SITE_OTHER): Payer: BLUE CROSS/BLUE SHIELD

## 2020-03-11 DIAGNOSIS — J309 Allergic rhinitis, unspecified: Secondary | ICD-10-CM | POA: Diagnosis not present

## 2020-03-18 ENCOUNTER — Ambulatory Visit (INDEPENDENT_AMBULATORY_CARE_PROVIDER_SITE_OTHER): Payer: BLUE CROSS/BLUE SHIELD

## 2020-03-18 DIAGNOSIS — J309 Allergic rhinitis, unspecified: Secondary | ICD-10-CM

## 2020-03-26 ENCOUNTER — Other Ambulatory Visit: Payer: Self-pay

## 2020-03-26 ENCOUNTER — Ambulatory Visit (INDEPENDENT_AMBULATORY_CARE_PROVIDER_SITE_OTHER): Payer: BLUE CROSS/BLUE SHIELD | Admitting: Allergy

## 2020-03-26 ENCOUNTER — Encounter: Payer: Self-pay | Admitting: Allergy

## 2020-03-26 ENCOUNTER — Ambulatory Visit: Payer: Self-pay | Admitting: *Deleted

## 2020-03-26 VITALS — BP 112/90 | HR 79 | Temp 98.4°F | Resp 18 | Ht 62.8 in | Wt 172.4 lb

## 2020-03-26 DIAGNOSIS — J3089 Other allergic rhinitis: Secondary | ICD-10-CM

## 2020-03-26 DIAGNOSIS — J4540 Moderate persistent asthma, uncomplicated: Secondary | ICD-10-CM

## 2020-03-26 DIAGNOSIS — Z91038 Other insect allergy status: Secondary | ICD-10-CM

## 2020-03-26 DIAGNOSIS — T781XXD Other adverse food reactions, not elsewhere classified, subsequent encounter: Secondary | ICD-10-CM

## 2020-03-26 DIAGNOSIS — J309 Allergic rhinitis, unspecified: Secondary | ICD-10-CM

## 2020-03-26 DIAGNOSIS — Z8709 Personal history of other diseases of the respiratory system: Secondary | ICD-10-CM | POA: Diagnosis not present

## 2020-03-26 DIAGNOSIS — J302 Other seasonal allergic rhinitis: Secondary | ICD-10-CM

## 2020-03-26 DIAGNOSIS — Z872 Personal history of diseases of the skin and subcutaneous tissue: Secondary | ICD-10-CM

## 2020-03-26 DIAGNOSIS — H1010 Acute atopic conjunctivitis, unspecified eye: Secondary | ICD-10-CM | POA: Insufficient documentation

## 2020-03-26 DIAGNOSIS — L2089 Other atopic dermatitis: Secondary | ICD-10-CM | POA: Diagnosis not present

## 2020-03-26 MED ORDER — BREO ELLIPTA 100-25 MCG/INH IN AEPB: 1.0000 | INHALATION_SPRAY | Freq: Every day | RESPIRATORY_TRACT | 5 refills | Status: DC

## 2020-03-26 NOTE — Assessment & Plan Note (Signed)
Past history - large localized reactions to fire ant in the past.  Continue avoidance and consider testing in the future.

## 2020-03-26 NOTE — Progress Notes (Signed)
Follow Up Note  RE: Jamie Sweeney MRN: [id number] DOB: 31-Oct-1965 Date of Office Visit: 03/26/2020  Referring provider: Stoney Bang, PA-C Primary care provider: Stoney Bang, PA-C  Chief Complaint: Allergic Rhinitis  (Doing okay eyes, face itchy, nose running) and Asthma (At night while sleeping it's hard to breath husband is a smoker but smokes outside)  History of Present Illness: I had the pleasure of seeing Jamie Sweeney for a follow up visit at the [facility] on 03/26/2020. She is a 54 y.o. female, who is being followed for allergic rhinoconjunctivitis on AIT, asthma, oral allergy syndrome, history of frequent upper respiratory infections, atopic dermatitis, history of urticaria, history of allergic reaction to insect bites. Her previous allergy office visit was on 10/24/2019 with Dr. Selena Batten. Today is a regular follow up visit.  Allergic rhino conjunctivitis Started allergy injections and doing well but having some localized reactions.  Using pataday and alaway as needed for itchy/watery eyes.  No recent eye exam but has appointment next week.  Takes allegra in the morning, zyrtec and Singulair in the evening.  Takes Flonase and azelastine nasal sprays with some benefit.  Patient diagnosed with Meniere's and had CT sinus which showed some abnormalities per patient report.  Scheduled for sinus surgery in September 2021.   Moderate persistent asthma Having some trouble breathing from her chest and sinuses. Patient stopped Arnuity 2 weeks ago and noticed worsening symptoms since stopped. Using albuterol sometimes with good benefit.   Food allergy Patient had sushi once and had some itching/facial flushing. Did not eat with soy sauce. Not sure if it was cross contaminated with something because she had sushi before with no issues and didn't eat anything unusual.   One time she had symptoms after eating a tv dinner which had edemame.   History of frequent upper  respiratory infection Patient had COVID-19 in April 2021 and May 2020.  Not up to date with COVID-19 vaccine.  Patient is having premature beats and being followed by cardiology.   Other atopic dermatitis Some dry skin.   Hx of allergy to insect bites No stings since the last visit.  Assessment and Plan: [name] is a 54 y.o. female with: Seasonal and perennial allergic rhinoconjunctivitis Past history - Perennial rhinoconjunctivitis symptoms for the past 20+ years with worsening in the spring and summer.  Patient has been on allergy immunotherapy on and off for about 10 years.  History of large localized reaction with one systemic reaction.  Last injection was in September 2020 at an outside facility. She has tried various antihistamines, Singulair, Astelin, Flonase, Pataday, Pazeo, cromolyn, Alaway with some benefit.  Sinus surgery in the past as well.  Takes Pepcid for reflux. 2021 skin testing showed: Positive to grass, weed, ragweed, trees, dust mites, cat, dog, mold.  Interim history - started AIT on 12/24/2019 (dustmite-mold-cat-dog and grass-weed-ragweed-tree) and having some localized reactions. Scheduled to have sinus surgery next month.  Continue environmental control measures - get HEPA filter.  May use over the counter antihistamines such as Zyrtec (cetirizine), Claritin (loratadine), Allegra (fexofenadine), or Xyzal (levocetirizine) daily as needed. May take twice a day during allergy flare.   Continue Singulair  daily at night.   May use Flonase (fluticasone) nasal spray 1 spray per nostril twice a day as needed for nasal congestion.   May use azelastine nasal spray 1-2 sprays per nostril twice a day as needed for runny nose/drainage.  Nasal saline spray (i.e., Simply Saline) or nasal saline  lavage (i.e., NeilMed) is recommended as needed and prior to medicated nasal sprays.  May use olopatadine eye drops 0.2% once a day as needed for itchy/watery eyes.  Follow up with  ENT as scheduled.   Continue allergy injections.  Moderate persistent asthma without complication Past history - Diagnosed with asthma over 30 years ago and currently using albuterol as needed.  Usually flares during the spring and summer and using albuterol 3-4 times a week lately. Patient is an ex-smoker. 2021 spirometry was normal with 7% improvement in FEV1 post bronchodilator treatment.  Patient clinically feeling better. Interim history - Had COVID-19 in April 2021 and May 2020. Stopped Arnuity 2 weeks ago and noticing some symptoms.  Today's spirometry was normal.  Daily controller medication(s):start Breo 1 puff daily and rinse mouth afterwards. Most likely will need to use during the spring and summer months when asthma flares.   May use albuterol rescue inhaler 2 puffs as needed for shortness of breath, chest tightness, coughing, and wheezing. May use albuterol rescue inhaler 2 puffs 5 to 15 minutes prior to strenuous physical activities. Monitor frequency of use.   Repeat spirometry at next visit.  Adverse food reaction Past history - Patient is concerned about food allergies as she has perioral itching to various foods including gray food, peanuts, tree nuts, avocado, certain foods.  Patient had IgG blood work which showed multiple positives per patient report by a naturopath. 2019 IgE blood work was positive to wheat, peanut, soy, hazelnut, almonds, cashews, scallop, sesame and walnut. 2021 skin testing showed: Borderline to peanut, soy, almond, hazelnut, pistachio, cottonseed, sweet potato, peas, navy bean, orange.  Interim history - had issues with sushi once and a TV dinner. She had sushi prior with no issues and not sure what triggered her symptoms as it does not occur everytime with the same food.  Continue to avoid foods that bother you.   Keep a food journal - we may need to retest to some of the foods at next visit.   For mild symptoms you can take over the  counter antihistamines such as Benadryl and monitor symptoms closely. If symptoms worsen or if you have severe symptoms including breathing issues, throat closure, significant swelling, whole body hives, severe diarrhea and vomiting, lightheadedness then inject epinephrine and seek immediate medical care afterwards.  Food action plan in place.   History of frequent upper respiratory infection Past history - History of frequent upper respiratory infections including pneumonias.  Usually takes 3-4 courses of antibiotics per year. 2021 immune bloodwork - low pneumococcal titers.  Interim history - had COVID-19 again in April 2021.  Recommend getting the COVID-19 vaccination.  I do not see if she received her pneumovax since the last visit either - will ask at next visit.   Keep track of infections and antibiotics use.   Other atopic dermatitis Stable.   Continue proper skin care measures.  Hx of allergy to insect bites Past history - large localized reactions to fire ant in the past.  Continue avoidance and consider testing in the future.  Return in about 3 months (around 06/26/2020).  Meds ordered this encounter  Medications  . fluticasone furoate-vilanterol (BREO ELLIPTA) 100-25 MCG/INH AEPB    Sig: Inhale 1 puff into the lungs daily. Rinse mouth after each use.    Dispense:  60 each    Refill:  5   Diagnostics: Spirometry:  Tracings reviewed. Her effort: Good reproducible efforts. FVC: 3.23L FEV1: 2.44L, 96% predicted FEV1/FVC ratio:  76% Interpretation: Spirometry consistent with normal pattern.  Please see scanned spirometry results for details.  Medication List:  Current Outpatient Medications  Medication Sig Dispense Refill  . acetaminophen (TYLENOL) 500 MG tablet Take 1,000 mg by mouth every 6 (six) hours as needed for mild pain or headache.    . albuterol (PROVENTIL HFA;VENTOLIN HFA) 108 (90 BASE) MCG/ACT inhaler Inhale 1 puff into the lungs every 6 (six) hours as  needed for wheezing or shortness of breath.    Marland Kitchen. atorvastatin (LIPITOR) 10 MG tablet Take by mouth.    Marland Kitchen. azelastine (ASTELIN) 0.1 % nasal spray Two sprays each nostril twice a day if needed for runny nose or drainage. 30 mL 5  . Azelastine-Fluticasone 137-50 MCG/ACT SUSP Place 1 spray into the nose in the morning and at bedtime. 23 g 5  . busPIRone (BUSPAR) 10 MG tablet Take by mouth.    . calcium carbonate (OS-CAL) 600 MG TABS tablet Take by mouth.    . Calcium-Vitamin D-Vitamin K (CALCIUM SOFT CHEWS PO) Take 500 mg by mouth 2 (two) times daily. Gummy    . celecoxib (CELEBREX) 200 MG capsule 1 capsule with food    . cetirizine (ZYRTEC) 10 MG tablet Take 10 mg by mouth daily.    . Cholecalciferol 25 MCG (1000 UT) capsule Take by mouth.    . cromolyn (OPTICROM) 4 % ophthalmic solution INSTILL 1 DROP INTO EACH EYE ONCE DAILY    . cyanocobalamin 1000 MCG tablet Take by mouth.    . diclofenac Sodium (VOLTAREN) 1 % GEL APPLY TO PAINFUL HAND UP TO 3 TIMES DAILY    . dicyclomine (BENTYL) 20 MG tablet TAKE 1 TABLET BY MOUTH 4 TIMES DAILY AS NEEDED    . Docusate Sodium (DSS) 100 MG CAPS Take by mouth.    . EPINEPHrine (AUVI-Q) 0.3 mg/0.3 mL IJ SOAJ injection Inject 0.3 mLs (0.3 mg total) into the muscle as needed for anaphylaxis. 2 each 1  . famotidine (PEPCID) 40 MG tablet Take by mouth.    . Ferrous Sulfate (SLOW FE) 142 (45 Fe) MG TBCR Take by mouth.    . fexofenadine (ALLEGRA) 180 MG tablet Take 180 mg by mouth daily.    . fluticasone (FLONASE) 50 MCG/ACT nasal spray Two sprays each nostril once a day if needed for nasal congestion. 16 mL 5  . Fluticasone Furoate (ARNUITY ELLIPTA) 100 MCG/ACT AEPB Inhale 1 puff into the lungs daily. 30 each 3  . folic acid (FOLVITE) 800 MCG tablet Take by mouth.    Marland Kitchen. ipratropium (ATROVENT) 0.03 % nasal spray Place into the nose.    Marland Kitchen. ketotifen (ALAWAY) 0.025 % ophthalmic solution Place 1 drop into both eyes 2 (two) times daily as needed (for allergies).    .  Lactobacillus Rhamnosus, GG, (RA PROBIOTIC DIGESTIVE CARE) CAPS Take by mouth.    . metroNIDAZOLE (METROCREAM) 0.75 % cream Apply topically.    . mometasone (ELOCON) 0.1 % cream For use on allergy injection sites as needed.    . montelukast (SINGULAIR) 10 MG tablet Take 10 mg by mouth at bedtime.    . Multiple Vitamin (MULTIVITAMIN) tablet Take 1 tablet by mouth daily.    . Olopatadine HCl 0.2 % SOLN Apply 1 drop to eye daily as needed (itchy/watery eyes). 2.5 mL 5  . Phentermine-Topiramate (QSYMIA) 3.75-23 MG CP24 Take 1 capsule by mouth daily.    . polyethylene glycol powder (GLYCOLAX/MIRALAX) 17 GM/SCOOP powder Take by mouth.    . Soft Lens Products (  REFRESH CONTACTS DROPS) SOLN Apply to eye.    . SUMAtriptan (IMITREX) 100 MG tablet 1 at tablet onset, may repeat once in 2 hours if needed    . tiZANidine (ZANAFLEX) 4 MG tablet 1 tablet as needed    . topiramate (TOPAMAX) 50 MG tablet Take 50-100 mg by mouth 2 (two) times daily.     . traZODone (DESYREL) 50 MG tablet Take 50 mg by mouth at bedtime.    . Alcaftadine (LASTACAFT) 0.25 % SOLN Lastacaft 0.25 % eye drops    . fluticasone furoate-vilanterol (BREO ELLIPTA) 100-25 MCG/INH AEPB Inhale 1 puff into the lungs daily. Rinse mouth after each use. 60 each 5  . TRULANCE 3 MG TABS Take 1 tablet by mouth daily.     No current facility-administered medications for this visit.   Allergies: Allergies  Allergen Reactions  . Other Other (See Comments) and Anaphylaxis    redness Beans and tree nuts  . Beef-Derived Products Hives and Other (See Comments)    Abdominal upset  . Grape Extract (Vitis Vinifera) [Vitis Viniferae] Hives and Other (See Comments)    Abdominal upset   . Bean Pod Extract Other (See Comments)  . Ciprofloxacin   . Grapefruit Extract Other (See Comments)    Itch/hives  . Latex Other (See Comments)    Itch/burn/rash   . Miconazole   . Monistat 1 [Tioconazole]   . Septra [Sulfamethoxazole-Trimethoprim]   . Azithromycin  Rash  . Diflucan [Fluconazole] Hives and Rash  . Sulfa Antibiotics Rash   I reviewed her past medical history, social history, family history, and environmental history and no significant changes have been reported from her previous visit.  Review of Systems  Constitutional: Negative for appetite change, chills, fever and unexpected weight change.  HENT: Positive for congestion, rhinorrhea and sneezing.   Eyes: Positive for itching.  Respiratory: Positive for shortness of breath. Negative for cough, chest tightness and wheezing.   Cardiovascular: Negative for chest pain.  Gastrointestinal: Negative for abdominal pain.  Genitourinary: Negative for difficulty urinating.  Skin: Negative for rash.  Allergic/Immunologic: Positive for environmental allergies and food allergies.  Neurological: Positive for headaches.   Objective: BP 112/90 (BP Location: Right Arm, Patient Position: Sitting, Cuff Size: Normal)   Pulse 79   Temp 98.4 F (36.9 C) (Oral)   Resp 18   Ht 5' 2.8" (1.595 m)   Wt 172 lb 6.4 oz (78.2 kg)   SpO2 100%   BMI 30.74 kg/m  Body mass index is 30.74 kg/m. Physical Exam Vitals and nursing note reviewed.  Constitutional:      Appearance: Normal appearance. She is well-developed.  HENT:     Head: Normocephalic and atraumatic.     Right Ear: Tympanic membrane and external ear normal.     Left Ear: Tympanic membrane and external ear normal.     Nose: Nose normal.     Mouth/Throat:     Mouth: Mucous membranes are moist.     Pharynx: Oropharynx is clear.  Eyes:     Conjunctiva/sclera: Conjunctivae normal.  Cardiovascular:     Rate and Rhythm: Normal rate and regular rhythm.     Heart sounds: Normal heart sounds. No murmur heard.  No friction rub. No gallop.   Pulmonary:     Effort: Pulmonary effort is normal.     Breath sounds: Normal breath sounds. No wheezing or rales.  Musculoskeletal:     Cervical back: Neck supple.  Skin:    General: Skin is warm.  Findings: No rash.  Neurological:     Mental Status: She is alert and oriented to person, place, and time.  Psychiatric:        Behavior: Behavior normal.    Previous notes and tests were reviewed. The plan was reviewed with the patient/family, and all questions/concerned were addressed.  It was my pleasure to see Jamie Sweeney today and participate in her care. Please feel free to contact me with any questions or concerns.  Sincerely,  Wyline Mood, DO Allergy & Immunology  [facility] office: [phone] [facility] office: [phone] [place] office: [phone]

## 2020-03-26 NOTE — Assessment & Plan Note (Addendum)
Past history - History of frequent upper respiratory infections including pneumonias.  Usually takes 3-4 courses of antibiotics per year. 2021 immune bloodwork - low pneumococcal titers.  Interim history - had COVID-19 again in April 2021.  Recommend getting the COVID-19 vaccination.  I do not see if she received her pneumovax since the last visit either - will ask at next visit.   Keep track of infections and antibiotics use.

## 2020-03-26 NOTE — Assessment & Plan Note (Signed)
Past history - Patient is concerned about food allergies as she has perioral itching to various foods including gray food, peanuts, tree nuts, avocado, certain foods.  Patient had IgG blood work which showed multiple positives per patient report by a naturopath. 2019 IgE blood work was positive to wheat, peanut, soy, hazelnut, almonds, cashews, scallop, sesame and walnut. 2021 skin testing showed: Borderline to peanut, soy, almond, hazelnut, pistachio, cottonseed, sweet potato, peas, navy bean, orange.  Interim history - had issues with sushi once and a TV dinner. She had sushi prior with no issues and not sure what triggered her symptoms as it does not occur everytime with the same food.  Continue to avoid foods that bother you.   Keep a food journal - we may need to retest to some of the foods at next visit.   For mild symptoms you can take over the counter antihistamines such as Benadryl and monitor symptoms closely. If symptoms worsen or if you have severe symptoms including breathing issues, throat closure, significant swelling, whole body hives, severe diarrhea and vomiting, lightheadedness then inject epinephrine and seek immediate medical care afterwards.  Food action plan in place.

## 2020-03-26 NOTE — Assessment & Plan Note (Signed)
Stable.   Continue proper skin care measures.

## 2020-03-26 NOTE — Patient Instructions (Addendum)
Allergic rhino conjunctivitis  Past skin testing showed: Positive to grass, weed, ragweed, trees, dust mites, cat, dog, mold.   Continue environmental control measures - get HEPA filter.  May use over the counter antihistamines such as Zyrtec (cetirizine), Claritin (loratadine), Allegra (fexofenadine), or Xyzal (levocetirizine) daily as needed. May take twice a day during allergy flare.   Continue Singulair 10mg  daily at night.   May use Flonase (fluticasone) nasal spray 1 spray per nostril twice a day as needed for nasal congestion.   May use azelastine nasal spray 1-2 sprays per nostril twice a day as needed for runny nose/drainage.  Nasal saline spray (i.e., Simply Saline) or nasal saline lavage (i.e., NeilMed) is recommended as needed and prior to medicated nasal sprays.  May use olopatadine eye drops 0.2% once a day as needed for itchy/watery eyes.  Follow up with ENT as scheduled.   Continue allergy injections.  Moderate persistent asthma   Daily controller medication(s):start Breo 1 puff daily and rinse mouth afterwards. Most likely will need to use during the spring and summer months when asthma flares.   May use albuterol rescue inhaler 2 puffs as needed for shortness of breath, chest tightness, coughing, and wheezing. May use albuterol rescue inhaler 2 puffs 5 to 15 minutes prior to strenuous physical activities. Monitor frequency of use.  Asthma control goals:  Full participation in all desired activities (may need albuterol before activity) Albuterol use two times or less a week on average (not counting use with activity) Cough interfering with sleep two times or less a month Oral steroids no more than once a year No hospitalizations  Food allergy 2019 IgE blood work was positive to wheat, peanut, soy, hazelnut, almonds, cashews, scallop, sesame and walnut. 2021 skin testing showed: Borderline to peanut, soy, almond, hazelnut, pistachio, cottonseed, sweet  potato, peas, navy bean, orange.   Continue to avoid foods that bother you.   Keep a food journal - we may need to retest to some of the foods at next visit.   For mild symptoms you can take over the counter antihistamines such as Benadryl and monitor symptoms closely. If symptoms worsen or if you have severe symptoms including breathing issues, throat closure, significant swelling, whole body hives, severe diarrhea and vomiting, lightheadedness then inject epinephrine and seek immediate medical care afterwards.  History of frequent upper respiratory infection  Keep track of infections. Recommend getting the COVID-19 vaccine.  Here's more information:  2022  Other atopic dermatitis  Continue proper skin care measures.  Hx of allergy to insect bites  Continue avoidance and consider testing in the future.  Follow up in 3 months or sooner if needed.    After September 2021, I will not be in the [facility] office on a regular basis.   You can continue your care and follow up with [facility] or nurse practitioner Nunzio Cobbs in [facility].  OR you can follow up with me in our [place] ([address]) or [place] [phone]) location.  Sincerely,  ([id number]J SEG, DO  [facility] office: [phone] [facility] office: [phone] [place] office: [phone]   Skin care recommendations  Bath time:  Always use lukewarm water. AVOID very hot or cold water.  Keep bathing time to 5-10 minutes.  Do NOT use bubble bath.  Use a mild soap and use just enough to wash the dirty areas.  Do NOT scrub skin vigorously.  moisturizers immediately after bathing (within 3 minutes). This helps to lock-in moisture. Use the moisturizer several times a day over the whole body. Good summer moisturizers include: Aveeno, CeraVe,  Cetaphil. Good winter moisturizers include: Aquaphor, Vaseline, Cerave, Cetaphil, Eucerin, Vanicream. When using moisturizers along with medications, the moisturizer should be applied about one hour after applying the medication to prevent diluting effect of the medication or moisturize around where you applied the medications. When not using medications, the moisturizer can be continued twice daily as maintenance.  Laundry and clothing: Avoid laundry products with added color or perfumes. Use unscented hypo-allergenic laundry products such as Tide free, Cheer free & gentle, and All free and clear.  If the skin still seems dry or sensitive, you can try double-rinsing the clothes. Avoid tight or scratchy clothing such as wool. Do not use fabric softeners or dyer sheets. 

## 2020-03-26 NOTE — Assessment & Plan Note (Addendum)
Past history - Perennial rhinoconjunctivitis symptoms for the past 20+ years with worsening in the spring and summer.  Patient has been on allergy immunotherapy on and off for about 10 years.  History of large localized reaction with one systemic reaction.  Last injection was in September 2020 at an outside facility. She has tried various antihistamines, Singulair, Astelin, Flonase, Pataday, Pazeo, cromolyn, Alaway with some benefit.  Sinus surgery in the past as well.  Takes Pepcid for reflux. 2021 skin testing showed: Positive to grass, weed, ragweed, trees, dust mites, cat, dog, mold.  Interim history - started AIT on 12/24/2019 (dustmite-mold-cat-dog and grass-weed-ragweed-tree) and having some localized reactions. Scheduled to have sinus surgery next month.  Continue environmental control measures - get HEPA filter.  May use over the counter antihistamines such as Zyrtec (cetirizine), Claritin (loratadine), Allegra (fexofenadine), or Xyzal (levocetirizine) daily as needed. May take twice a day during allergy flare.   Continue Singulair 10mg  daily at night.   May use Flonase (fluticasone) nasal spray 1 spray per nostril twice a day as needed for nasal congestion.   May use azelastine nasal spray 1-2 sprays per nostril twice a day as needed for runny nose/drainage.  Nasal saline spray (i.e., Simply Saline) or nasal saline lavage (i.e., NeilMed) is recommended as needed and prior to medicated nasal sprays.  May use olopatadine eye drops 0.2% once a day as needed for itchy/watery eyes.  Follow up with ENT as scheduled.   Continue allergy injections.

## 2020-03-26 NOTE — Assessment & Plan Note (Signed)
Past history - Diagnosed with asthma over 30 years ago and currently using albuterol as needed.  Usually flares during the spring and summer and using albuterol 3-4 times a week lately. Patient is an ex-smoker. 2021 spirometry was normal with 7% improvement in FEV1 post bronchodilator treatment.  Patient clinically feeling better. Interim history - Had COVID-19 in April 2021 and May 2020. Stopped Arnuity 2 weeks ago and noticing some symptoms.  Today's spirometry was normal.  Daily controller medication(s):start Breo 1 puff daily and rinse mouth afterwards. Most likely will need to use during the spring and summer months when asthma flares.   May use albuterol rescue inhaler 2 puffs as needed for shortness of breath, chest tightness, coughing, and wheezing. May use albuterol rescue inhaler 2 puffs 5 to 15 minutes prior to strenuous physical activities. Monitor frequency of use.   Repeat spirometry at next visit.

## 2020-04-13 ENCOUNTER — Ambulatory Visit (INDEPENDENT_AMBULATORY_CARE_PROVIDER_SITE_OTHER): Payer: BLUE CROSS/BLUE SHIELD

## 2020-04-13 DIAGNOSIS — J309 Allergic rhinitis, unspecified: Secondary | ICD-10-CM

## 2020-04-30 ENCOUNTER — Ambulatory Visit (INDEPENDENT_AMBULATORY_CARE_PROVIDER_SITE_OTHER): Payer: Medicare HMO

## 2020-04-30 DIAGNOSIS — J309 Allergic rhinitis, unspecified: Secondary | ICD-10-CM

## 2020-05-06 ENCOUNTER — Ambulatory Visit (INDEPENDENT_AMBULATORY_CARE_PROVIDER_SITE_OTHER): Payer: Medicare HMO

## 2020-05-06 DIAGNOSIS — J309 Allergic rhinitis, unspecified: Secondary | ICD-10-CM | POA: Diagnosis not present

## 2020-05-20 ENCOUNTER — Ambulatory Visit (INDEPENDENT_AMBULATORY_CARE_PROVIDER_SITE_OTHER): Payer: Medicare HMO

## 2020-05-20 DIAGNOSIS — J309 Allergic rhinitis, unspecified: Secondary | ICD-10-CM

## 2020-05-26 ENCOUNTER — Ambulatory Visit (INDEPENDENT_AMBULATORY_CARE_PROVIDER_SITE_OTHER): Payer: Medicare HMO

## 2020-05-26 DIAGNOSIS — J309 Allergic rhinitis, unspecified: Secondary | ICD-10-CM

## 2020-05-27 ENCOUNTER — Ambulatory Visit: Payer: BLUE CROSS/BLUE SHIELD | Admitting: Allergy

## 2020-06-09 ENCOUNTER — Ambulatory Visit (INDEPENDENT_AMBULATORY_CARE_PROVIDER_SITE_OTHER): Payer: Medicare HMO

## 2020-06-09 DIAGNOSIS — J309 Allergic rhinitis, unspecified: Secondary | ICD-10-CM | POA: Diagnosis not present

## 2020-06-14 ENCOUNTER — Ambulatory Visit (INDEPENDENT_AMBULATORY_CARE_PROVIDER_SITE_OTHER): Payer: Medicare HMO

## 2020-06-14 DIAGNOSIS — J309 Allergic rhinitis, unspecified: Secondary | ICD-10-CM

## 2020-07-06 ENCOUNTER — Ambulatory Visit (INDEPENDENT_AMBULATORY_CARE_PROVIDER_SITE_OTHER): Payer: Medicare HMO

## 2020-07-06 DIAGNOSIS — J309 Allergic rhinitis, unspecified: Secondary | ICD-10-CM

## 2020-07-31 HISTORY — PX: SHOULDER ARTHROSCOPY: SHX128

## 2020-09-03 ENCOUNTER — Other Ambulatory Visit: Payer: Self-pay

## 2020-09-03 MED ORDER — BREO ELLIPTA 100-25 MCG/INH IN AEPB: 1.0000 | INHALATION_SPRAY | Freq: Every day | RESPIRATORY_TRACT | 0 refills | Status: AC

## 2020-09-03 MED ORDER — ALBUTEROL SULFATE HFA 108 (90 BASE) MCG/ACT IN AERS: 1.0000 | INHALATION_SPRAY | Freq: Four times a day (QID) | RESPIRATORY_TRACT | 0 refills | Status: AC | PRN

## 2020-09-07 ENCOUNTER — Ambulatory Visit (INDEPENDENT_AMBULATORY_CARE_PROVIDER_SITE_OTHER): Payer: Medicare HMO

## 2020-09-07 DIAGNOSIS — J309 Allergic rhinitis, unspecified: Secondary | ICD-10-CM

## 2020-09-08 DIAGNOSIS — J301 Allergic rhinitis due to pollen: Secondary | ICD-10-CM

## 2020-09-08 NOTE — Progress Notes (Signed)
VIALS EXP 09-08-21 

## 2020-09-09 DIAGNOSIS — J3089 Other allergic rhinitis: Secondary | ICD-10-CM

## 2020-09-14 ENCOUNTER — Ambulatory Visit (INDEPENDENT_AMBULATORY_CARE_PROVIDER_SITE_OTHER): Payer: Medicare HMO

## 2020-09-14 DIAGNOSIS — J309 Allergic rhinitis, unspecified: Secondary | ICD-10-CM

## 2021-06-02 ENCOUNTER — Ambulatory Visit: Payer: Medicare HMO | Admitting: Internal Medicine

## 2021-06-02 ENCOUNTER — Encounter: Payer: Self-pay | Admitting: Internal Medicine

## 2021-06-02 ENCOUNTER — Other Ambulatory Visit: Payer: Self-pay

## 2021-06-02 VITALS — BP 112/78 | HR 78 | Temp 97.7°F | Resp 18 | Ht 63.0 in | Wt 170.4 lb

## 2021-06-02 DIAGNOSIS — J3089 Other allergic rhinitis: Secondary | ICD-10-CM

## 2021-06-02 DIAGNOSIS — J329 Chronic sinusitis, unspecified: Secondary | ICD-10-CM | POA: Diagnosis not present

## 2021-06-02 DIAGNOSIS — J4540 Moderate persistent asthma, uncomplicated: Secondary | ICD-10-CM | POA: Diagnosis not present

## 2021-06-02 DIAGNOSIS — Z8709 Personal history of other diseases of the respiratory system: Secondary | ICD-10-CM

## 2021-06-02 DIAGNOSIS — L2089 Other atopic dermatitis: Secondary | ICD-10-CM

## 2021-06-02 DIAGNOSIS — Z91038 Other insect allergy status: Secondary | ICD-10-CM

## 2021-06-02 DIAGNOSIS — J302 Other seasonal allergic rhinitis: Secondary | ICD-10-CM

## 2021-06-02 DIAGNOSIS — H1013 Acute atopic conjunctivitis, bilateral: Secondary | ICD-10-CM

## 2021-06-02 DIAGNOSIS — J339 Nasal polyp, unspecified: Secondary | ICD-10-CM

## 2021-06-02 DIAGNOSIS — T781XXD Other adverse food reactions, not elsewhere classified, subsequent encounter: Secondary | ICD-10-CM

## 2021-06-02 DIAGNOSIS — H1010 Acute atopic conjunctivitis, unspecified eye: Secondary | ICD-10-CM

## 2021-06-02 MED ORDER — FLUTICASONE FUROATE-VILANTEROL 100-25 MCG/ACT IN AEPB: 1.0000 | INHALATION_SPRAY | Freq: Every day | RESPIRATORY_TRACT | 1 refills | Status: AC

## 2021-06-02 MED ORDER — EPINEPHRINE 0.3 MG/0.3ML IJ SOAJ
0.3000 mg | INTRAMUSCULAR | 1 refills | Status: AC | PRN
Start: 2021-06-02 — End: ?

## 2021-06-02 MED ORDER — XHANCE 93 MCG/ACT NA EXHU: 2.0000 | INHALANT_SUSPENSION | Freq: Every day | NASAL | 3 refills | Status: AC

## 2021-06-02 MED ORDER — ALBUTEROL SULFATE HFA 108 (90 BASE) MCG/ACT IN AERS: 2.0000 | INHALATION_SPRAY | RESPIRATORY_TRACT | 3 refills | Status: AC | PRN

## 2021-06-02 NOTE — Progress Notes (Signed)
FOLLOW UP Date of Service/Encounter:  06/02/21   Subjective:  Jamie Sweeney (DOB: October 30, 1965) is a 55 y.o. female who [facility] in re-evaluation of the following: Seasonal and perennial allergic rhinoconjunctivitis status post sinuplasty 2021, moderate persistent asthma, recurrent infections History obtained from: chart review and patient.  For Review, LV was on 03/26/20  with Dr. Selena Batten seen for allergic rhinitis and moderate persistent asthma concern for food allergy.   Seasonal and perennial allergic rhinoconjunctivitis:  Perennial rhinoconjunctivitis symptoms for the past 20+ years with worsening in the spring and summer.  Patient has been on allergy immunotherapy on and off for about 10 years.  History of large localized reaction with one systemic reaction. She has tried various antihistamines, Singulair, Astelin, Flonase, Pataday, Pazeo, cromolyn, Alaway with some benefit.   Sinus surgery in the past as well.  Last in 2021.  Follows with ENT yearly. Takes Pepcid for reflux.  2021 skin testing showed: Positive to grass, weed, ragweed, trees, dust mites, cat, dog, mold.  Started AIT on 12/24/2019 (dustmite-mold-cat-dog and grass-weed-ragweed-tree) and having some localized reactions.  Stopped again on 08/2020 Moderate persistent asthma without complication Diagnosed with asthma over 30 years ago and currently using albuterol as needed.  Usually flares during the spring and summer.  ex-smoker.  2021 spirometry was normal with 7% improvement in FEV1 post bronchodilator treatment.  Started on Breo 100 1 puff daily at last visit. Concern for Food Allergies:  perioral itching to various foods including gray food, peanuts, tree nuts, avocado, certain foods.  Patient had IgG blood work which showed multiple positives per patient report by a naturopath.  2019 IgE blood work was positive to wheat, peanut, soy, hazelnut, almonds, cashews, scallop, sesame and  walnut.  2021 skin testing showed: Borderline to peanut, soy, almond, hazelnut, pistachio, cottonseed, sweet potato, peas, navy bean, orange.  History of frequent upper respiratory infection History of frequent upper respiratory infections including pneumonias.  Usually takes 3-4 courses of antibiotics per year. 2021 immune bloodwork - low pneumococcal titers.  Repeat titers following vaccination in May 2021 were adequate. ------------------------------------------------------------------------ Today she presents for follow-up. She did have sinus surgery 2021 - sinus reconstruction with polyp removal.  She has had 4 sinus infections this year.   She was treated by ENT yesterday for sinusitis and given Kenalog. She was on allergy shots since her last visit, but her last shot was 08/2020.  She had to stop secondary to hysterectomy and difficulty getting here. She takes Allegra in AM, Singulair and Zyrtec PM.  She uses pataday or something similar PRN as well as Alaway.  She also uses Flonase daily.   Told to stay only on Flonase following sinus surgery by her ENT. She would like to restart allergy shots.  She also uses Breo 1 puff daily during allergy seasons.  She is using albuterol very infrequently and feels that Virgel Bouquet has helped control her symptoms.  Has used Breo during flares during Spring, Fall and 1 airway illness.   CT 02/2021: Opacification of the right frontonasal duct with otherwise patent right frontal sinus. Otherwise, no significant paranasal sinus mucosal thickening status post functional endoscopic sinus surgery.  She does have some itching at site of her shingles booster.  She has mometasone topical steroid cream at home.    Allergies as of 06/02/2021       Reactions   Other Other (See Comments), Anaphylaxis   redness Beans and tree nuts   Beef-derived  Products Hives, Other (See Comments)   Abdominal upset   Grape Extract (vitis Vinifera) [vitis Viniferae] Hives, Other (See  Comments)   Abdominal upset    Sulfa Antibiotics Rash   Bean Pod Extract Other (See Comments)   Ciprofloxacin    Grapefruit Extract Other (See Comments)   Itch/hives   Latex Other (See Comments)   Itch/burn/rash    Miconazole    Monistat 1 [tioconazole]    Septra [sulfamethoxazole-trimethoprim]    Azithromycin Rash   Diflucan [fluconazole] Hives, Rash        Medication List        Accurate as of June 02, 2021  6:20 PM. If you have any questions, ask your nurse or doctor.          STOP taking these medications    Arnuity Ellipta 100 MCG/ACT Aepb Generic drug: Fluticasone Furoate Stopped by: Tonny Bollman, MD       TAKE these medications    acetaminophen 500 MG tablet Commonly known as: TYLENOL Take 1,000 mg by mouth every 6 (six) hours as needed for mild pain or headache.   albuterol 108 (90 Base) MCG/ACT inhaler Commonly known as: VENTOLIN HFA Inhale 1 puff into the lungs every 6 (six) hours as needed for wheezing or shortness of breath. What changed: Another medication with the same name was added. Make sure you understand how and when to take each. Changed by: Tonny Bollman, MD   albuterol 108 (90 Base) MCG/ACT inhaler Commonly known as: ProAir HFA Inhale 2 puffs into the lungs every 4 (four) hours as needed for wheezing or shortness of breath. What changed: You were already taking a medication with the same name, and this prescription was added. Make sure you understand how and when to take each. Changed by: Tonny Bollman, MD   atorvastatin 10 MG tablet Commonly known as: LIPITOR Take by mouth.   azelastine 0.1 % nasal spray Commonly known as: ASTELIN Two sprays each nostril twice a day if needed for runny nose or drainage.   Azelastine-Fluticasone 137-50 MCG/ACT Susp Place 1 spray into the nose in the morning and at bedtime.   Breo Ellipta 100-25 MCG/ACT Aepb Generic drug: fluticasone furoate-vilanterol Inhale 1 puff into the lungs daily. Rinse  mouth after each use. What changed: Another medication with the same name was added. Make sure you understand how and when to take each. Changed by: Tonny Bollman, MD   fluticasone furoate-vilanterol 100-25 MCG/ACT Aepb Commonly known as: Breo Ellipta Inhale 1 puff into the lungs daily. What changed: You were already taking a medication with the same name, and this prescription was added. Make sure you understand how and when to take each. Changed by: Tonny Bollman, MD   busPIRone 10 MG tablet Commonly known as: BUSPAR Take by mouth.   calcium carbonate 600 MG Tabs tablet Commonly known as: OS-CAL Take by mouth.   CALCIUM SOFT CHEWS PO Take 500 mg by mouth 2 (two) times daily. Gummy   celecoxib 200 MG capsule Commonly known as: CELEBREX 1 capsule with food   cetirizine 10 MG tablet Commonly known as: ZYRTEC Take 10 mg by mouth daily.   Cholecalciferol 25 MCG (1000 UT) capsule Take by mouth.   cromolyn 4 % ophthalmic solution Commonly known as: OPTICROM INSTILL 1 DROP INTO EACH EYE ONCE DAILY   cyanocobalamin 1000 MCG tablet Take by mouth.   diclofenac Sodium 1 % Gel Commonly known as: VOLTAREN APPLY TO PAINFUL HAND UP TO 3 TIMES DAILY  dicyclomine 20 MG tablet Commonly known as: BENTYL TAKE 1 TABLET BY MOUTH 4 TIMES DAILY AS NEEDED   DSS 100 MG Caps Take by mouth.   EPINEPHrine 0.3 mg/0.3 mL Soaj injection Commonly known as: Auvi-Q Inject 0.3 mg into the muscle as needed for anaphylaxis.   famotidine 40 MG tablet Commonly known as: PEPCID Take by mouth.   fexofenadine 180 MG tablet Commonly known as: ALLEGRA Take 180 mg by mouth daily.   fluticasone 50 MCG/ACT nasal spray Commonly known as: FLONASE Two sprays each nostril once a day if needed for nasal congestion. What changed: Another medication with the same name was added. Make sure you understand how and when to take each. Changed by: Tonny Bollman, MD   Charmayne Sheer MCG/ACT Exhu Generic drug:  Fluticasone Propionate Place 2 sprays into the nose daily. What changed: You were already taking a medication with the same name, and this prescription was added. Make sure you understand how and when to take each. Changed by: Tonny Bollman, MD   folic acid 800 MCG tablet Commonly known as: FOLVITE Take by mouth.   ipratropium 0.03 % nasal spray Commonly known as: ATROVENT Place into the nose.   ketotifen 0.025 % ophthalmic solution Commonly known as: ZADITOR Place 1 drop into both eyes 2 (two) times daily as needed (for allergies).   Lastacaft 0.25 % Soln Generic drug: Alcaftadine Lastacaft 0.25 % eye drops   metroNIDAZOLE 0.75 % cream Commonly known as: METROCREAM Apply topically.   mometasone 0.1 % cream Commonly known as: ELOCON For use on allergy injection sites as needed.   montelukast 10 MG tablet Commonly known as: SINGULAIR Take 10 mg by mouth at bedtime.   multivitamin tablet Take 1 tablet by mouth daily.   Olopatadine HCl 0.2 % Soln Apply 1 drop to eye daily as needed (itchy/watery eyes).   polyethylene glycol powder 17 GM/SCOOP powder Commonly known as: GLYCOLAX/MIRALAX Take by mouth.   Qsymia 3.75-23 MG Cp24 Generic drug: Phentermine-Topiramate Take 1 capsule by mouth daily.   RA Probiotic Digestive Care Caps Take by mouth.   Refresh Contacts Drops Soln Apply to eye.   Slow Fe 142 (45 Fe) MG Tbcr Generic drug: Ferrous Sulfate Take by mouth.   SUMAtriptan 100 MG tablet Commonly known as: IMITREX 1 at tablet onset, may repeat once in 2 hours if needed   tiZANidine 4 MG tablet Commonly known as: ZANAFLEX 1 tablet as needed   topiramate 50 MG tablet Commonly known as: TOPAMAX Take 50-100 mg by mouth 2 (two) times daily.   traZODone 50 MG tablet Commonly known as: DESYREL Take 50 mg by mouth at bedtime.   Trulance 3 MG Tabs Generic drug: Plecanatide Take 1 tablet by mouth daily.       Past Medical History:  Diagnosis Date    Anxiety    not severe   Asthma    mild   Bronchitis, chronic (HCC)    Complication of anesthesia    problems waking up after shoulder surgery   Depression    not severe   GERD (gastroesophageal reflux disease)    Internal hemorrhoids with bleeding 04/10/2012   Migraine headache    PONV (postoperative nausea and vomiting)    Reflux    Seizures (HCC) as child   EEG done-MD said it was due to stress, none since age 44   Varicose veins    with superficial clotting   Past Surgical History:  Procedure Laterality Date   CLOSED MANIPULATION  SHOULDER Right 08/01/2007   FOOT SURGERY  07/31/2010   right plantar   HEMANGIOMA EXCISION  07/31/1996   right hand   HEMORRHOID BANDING  03/31/2012   HEMORRHOID BANDING  09/28/2012   KNEE SURGERY Right 07/31/2006   scope   NASAL SEPTUM SURGERY  07/31/2004   SHOULDER ARTHROSCOPY Right 07/2020   SHOULDER SURGERY  08/01/2007   right with clavical resection   TRANSANAL HEMORRHOIDAL DEARTERIALIZATION N/A 03/12/2014   Procedure: THD HEMORRIHOIDAL LIGATION/PEXY EXAM UNDER ANESTHESIA, HEMORRHOIDECTOMY;  Surgeon: Ardeth Sportsman, MD;  Location: [facility];  Service: General;  Laterality: N/A;   TYMPANOSTOMY TUBE PLACEMENT Left 07/31/1996   infected ear-removed in 2000   vulvoplasty  07/31/1996   Otherwise, there have been no changes to her past medical history, surgical history, family history, or social history.  ROS: All others negative except as noted per HPI.   Objective:  BP 112/78   Pulse 78   Temp 97.7 F (36.5 C) (Temporal)   Resp 18   Ht 5\' 3"  (1.6 m)   Wt 170 lb 6.4 oz (77.3 kg)   SpO2 98%   BMI 30.19 kg/m  Body mass index is 30.19 kg/m. Physical Exam: General Appearance:  Alert, cooperative, no distress, appears stated age  Head:  Normocephalic, without obvious abnormality, atraumatic  Eyes:  Conjunctiva clear, EOM's intact  Nose: Nares normal, hypertrophic turbinates bilaterally  Throat: Lips, tongue normal; teeth and gums  normal, normal posterior oropharynx  Neck: Supple, symmetrical  Lungs:   Clear to auscultation bilaterally, respirations unlabored, no coughing  Heart:  Regular rate and rhythm, no murmur, appears well perfused  Extremities: No edema  Skin: Skin color, texture, turgor normal, no rashes or lesions on visualized portions of skin  Neurologic: No gross deficits   Spirometry:  Tracings reviewed. Her effort: Good reproducible efforts. FVC: 2.78L FEV1: 2.08L, 80% predicted FEV1/FVC ratio: 94% Interpretation: Spirometry consistent with normal pattern.  Please see scanned spirometry results for details.  Assessment/Plan   Patient Instructions  Allergic rhino conjunctivitis with history of nasal polyps-uncontrolled Past skin testing showed: Positive to grass, weed, ragweed, trees, dust mites, cat, dog, mold.  Continue environmental control measures  May use over the counter antihistamines such as Zyrtec (cetirizine), Claritin (loratadine), Allegra (fexofenadine), or Xyzal (levocetirizine) daily as needed. May take twice a day during allergy flare.  Continue Singulair 10mg  daily at night.  May use Flonase (fluticasone) nasal spray 1-2 sprays per nostril twice a day as needed for nasal congestion.  Nasal saline spray (i.e., Simply Saline) or nasal saline lavage (i.e., NeilMed) is recommended as needed and prior to medicated nasal sprays. May use olopatadine eye drops 0.2% once a day as needed for itchy/watery eyes. Follow up with ENT as scheduled.  Restart allergy injections. Same Rx. If polyps return, consider Dupixent   Moderate persistent asthma -controlled Daily controller medication(s): continue Breo 1 puff daily and rinse mouth afterwards. Most likely will need to use during the spring and summer months when asthma flares.  May use albuterol rescue inhaler 2 puffs as needed for shortness of breath, chest tightness, coughing, and wheezing. May use albuterol rescue inhaler 2 puffs 5  to 15 minutes prior to strenuous physical activities. Monitor frequency of use.  Asthma control goals:  Full participation in all desired activities (may need albuterol before activity) Albuterol use two times or less a week on average (not counting use with activity) Cough interfering with sleep two times or less a month Oral steroids no  more than once a year No hospitalizations   Food allergy-stable 2019 IgE blood work was positive to wheat, peanut, soy, hazelnut, almonds, cashews, scallop, sesame and walnut. 2021 skin testing showed: Borderline to peanut, soy, almond, hazelnut, pistachio, cottonseed, sweet potato, peas, navy bean, orange.  Continue to avoid foods that bother you.  Keep a food journal - we may need to retest to some of the foods at next visit.  For mild symptoms you can take over the counter antihistamines such as Benadryl and monitor symptoms closely. If symptoms worsen or if you have severe symptoms including breathing issues, throat closure, significant swelling, whole body hives, severe diarrhea and vomiting, lightheadedness then inject epinephrine and seek immediate medical care afterwards.  History of frequent upper respiratory infection Keep track of infections. Repeat quant antibodies today If remain normal, suspect anatomical etiology for symptoms.  Other atopic dermatitis-stable Continue proper skin care measures.   Hx of allergy to insect bites-stable Continue avoidance and consider testing in the future.   Follow up in 3 months or sooner if needed.   Tonny Bollman, MD  [facility]

## 2021-06-02 NOTE — Patient Instructions (Signed)
Allergic rhino conjunctivitis Past skin testing showed: Positive to grass, weed, ragweed, trees, dust mites, cat, dog, mold.  Continue environmental control measures - get HEPA filter. May use over the counter antihistamines such as Zyrtec (cetirizine), Claritin (loratadine), Allegra (fexofenadine), or Xyzal (levocetirizine) daily as needed. May take twice a day during allergy flare.  Continue Singulair 10mg  daily at night.  May use Flonase (fluticasone) nasal spray 1 spray per nostril twice a day as needed for nasal congestion.  May use azelastine nasal spray 1-2 sprays per nostril twice a day as needed for runny nose/drainage. Nasal saline spray (i.e., Simply Saline) or nasal saline lavage (i.e., NeilMed) is recommended as needed and prior to medicated nasal sprays. May use olopatadine eye drops 0.2% once a day as needed for itchy/watery eyes. Follow up with ENT as scheduled.  Continue allergy injections.   Moderate persistent asthma  Daily controller medication(s): start Breo 1 puff daily and rinse mouth afterwards. Most likely will need to use during the spring and summer months when asthma flares.  May use albuterol rescue inhaler 2 puffs as needed for shortness of breath, chest tightness, coughing, and wheezing. May use albuterol rescue inhaler 2 puffs 5 to 15 minutes prior to strenuous physical activities. Monitor frequency of use.  Asthma control goals:  Full participation in all desired activities (may need albuterol before activity) Albuterol use two times or less a week on average (not counting use with activity) Cough interfering with sleep two times or less a month Oral steroids no more than once a year No hospitalizations   Food allergy 2019 IgE blood work was positive to wheat, peanut, soy, hazelnut, almonds, cashews, scallop, sesame and walnut. 2021 skin testing showed: Borderline to peanut, soy, almond, hazelnut, pistachio, cottonseed, sweet potato, peas, navy bean,  orange.  Continue to avoid foods that bother you.  Keep a food journal - we may need to retest to some of the foods at next visit.  For mild symptoms you can take over the counter antihistamines such as Benadryl and monitor symptoms closely. If symptoms worsen or if you have severe symptoms including breathing issues, throat closure, significant swelling, whole body hives, severe diarrhea and vomiting, lightheadedness then inject epinephrine and seek immediate medical care afterwards.  History of frequent upper respiratory infection Keep track of infections. Recommend getting the COVID-19 vaccine.  Here's more information: 2022  Other atopic dermatitis Continue proper skin care measures.   Hx of allergy to insect bites Continue avoidance and consider testing in the future.   Follow up in 3 months or sooner if needed.   After September 2021, I will not be in the [facility] office on a regular basis.  You can continue your care and follow up with [facility] or nurse practitioner Nunzio Cobbs in [facility] [place]. OR you can follow up with me in our [place] ([address]) or [place] [phone]) location.  Sincerely,  ([id number], DO  [facility] office: [phone] [facility] office: [phone] [place] office: [phone]   Skin care recommendations  Bath time: Always use lukewarm water. AVOID very hot or cold water. Keep bathing time to 5-10 minutes. Do NOT use bubble bath. Use a mild soap and use just enough to wash the dirty areas. Do NOT scrub skin vigorously.  After bathing, pat dry your skin with a towel. Do NOT rub or scrub the skin.  Moisturizers and prescriptions:  ALWAYS apply  moisturizers immediately after bathing (within 3 minutes). This helps to lock-in moisture. Use the moisturizer several times a day over the whole body. Good summer moisturizers include: Aveeno, CeraVe,  Cetaphil. Good winter moisturizers include: Aquaphor, Vaseline, Cerave, Cetaphil, Eucerin, Vanicream. When using moisturizers along with medications, the moisturizer should be applied about one hour after applying the medication to prevent diluting effect of the medication or moisturize around where you applied the medications. When not using medications, the moisturizer can be continued twice daily as maintenance.  Laundry and clothing: Avoid laundry products with added color or perfumes. Use unscented hypo-allergenic laundry products such as Tide free, Cheer free & gentle, and All free and clear.  If the skin still seems dry or sensitive, you can try double-rinsing the clothes. Avoid tight or scratchy clothing such as wool. Do not use fabric softeners or dyer sheets.

## 2021-06-08 DIAGNOSIS — J301 Allergic rhinitis due to pollen: Secondary | ICD-10-CM | POA: Diagnosis not present

## 2021-06-08 NOTE — Progress Notes (Signed)
VIALS MADE. EXP 06-08-22

## 2021-06-09 DIAGNOSIS — J3089 Other allergic rhinitis: Secondary | ICD-10-CM | POA: Diagnosis not present

## 2021-06-15 ENCOUNTER — Telehealth: Payer: Self-pay | Admitting: Internal Medicine

## 2021-06-15 NOTE — Telephone Encounter (Signed)
Please advise to IGa lab test

## 2021-06-15 NOTE — Telephone Encounter (Signed)
Pt has not had blood work yet because she has been sick but would like to know if her lab orders included testing for Iga deficiency. If not she would like to be tested because it runs in her family.

## 2021-06-16 NOTE — Telephone Encounter (Signed)
Lm for pt to call us back about this °

## 2021-06-21 NOTE — Telephone Encounter (Signed)
[name] for pt explaining she had the iga testing done before and if she has any further questions to call our office

## 2021-06-30 ENCOUNTER — Ambulatory Visit: Payer: Medicare HMO

## 2021-07-01 ENCOUNTER — Ambulatory Visit: Payer: Medicare HMO

## 2021-08-15 ENCOUNTER — Ambulatory Visit: Payer: Medicare HMO | Admitting: Internal Medicine

## 2024-07-18 ENCOUNTER — Other Ambulatory Visit: Payer: Self-pay | Admitting: Medical Genetics
